# Patient Record
Sex: Female | Born: 2000 | Race: Black or African American | Hispanic: No | Smoking: Never smoker
Health system: Southern US, Community
[De-identification: ages and names within clinical notes are randomized; demographics above are authoritative.]

## PROBLEM LIST (undated history)

## (undated) DIAGNOSIS — B999 Unspecified infectious disease: Secondary | ICD-10-CM

## (undated) DIAGNOSIS — A749 Chlamydial infection, unspecified: Secondary | ICD-10-CM

## (undated) DIAGNOSIS — D649 Anemia, unspecified: Secondary | ICD-10-CM

## (undated) DIAGNOSIS — Z789 Other specified health status: Secondary | ICD-10-CM

## (undated) HISTORY — PX: NO PAST SURGERIES: SHX2092

## (undated) HISTORY — DX: Other specified health status: Z78.9

---
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
## 2020-01-30 ENCOUNTER — Ambulatory Visit: Payer: Medicaid Other | Admitting: Certified Nurse Midwife

## 2020-10-11 NOTE — L&D Delivery Note (Signed)
OB/GYN Faculty Practice Delivery Note  Crystal Roach is a 20 y.o. G2P1001 s/p SVD at [redacted]w[redacted]d. She was admitted for SOL.   ROM: 1h 65m with clear fluid GBS Status: negative Maximum Maternal Temperature: 98.9  Delivery Date/Time: 1121 Delivery: Called to room and patient was complete and pushing. Head delivered ROA. No nuchal cord present. Shoulder and body delivered in usual fashion. Infant with spontaneous cry, placed on mother's abdomen, dried and stimulated. Cord clamped x 2 after 1-minute delay, and cut by FOB under my direct supervision. Cord blood drawn. Placenta delivered spontaneously with gentle cord traction. Fundus firm with massage and Pitocin. Labia, perineum, vagina, and cervix were inspected, with a hemostatic first degree laceration. Laceration was repaired with 3-0 vicryl.   Placenta: 3VC, membranes intact >placenta to L&D Complications: none Lacerations: 1st degree EBL: 50 Analgesia: epidural  Infant: baby girl   APGARs 8,9   Crystal Cruz, MD PGY-2 Family Medicine Resident

## 2021-01-14 IMAGING — US OBSTETRIC <14 WK US AND TRANSVAGINAL OB US
1 series · 15 of 28 positions shown · non-contrast
Comparison: None.

CLINICAL DATA: Pregnant patient with vaginal bleeding.

EXAM:
OBSTETRIC <14 WK US AND TRANSVAGINAL OB US
TECHNIQUE: Both transabdominal and transvaginal ultrasound examinations were
performed for complete evaluation of the gestation as well as the
maternal uterus, adnexal regions, and pelvic cul-de-sac.
Transvaginal technique was performed to assess early pregnancy.

[Series 1: obstetric <14 wk us and transvaginal ob us · 65 acquisitions, 15 frames shown]
[im 1/65]
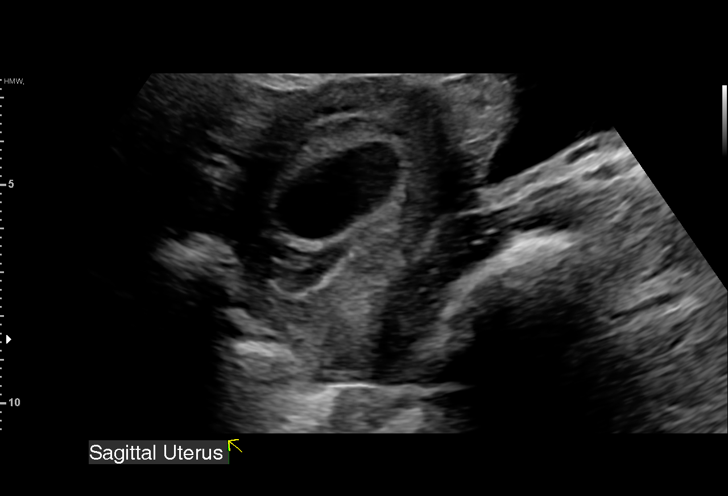
[im 5/65]
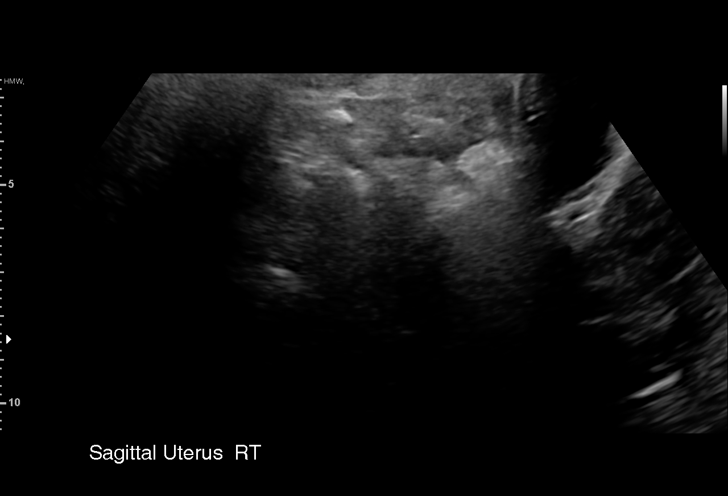
[im 10/65]
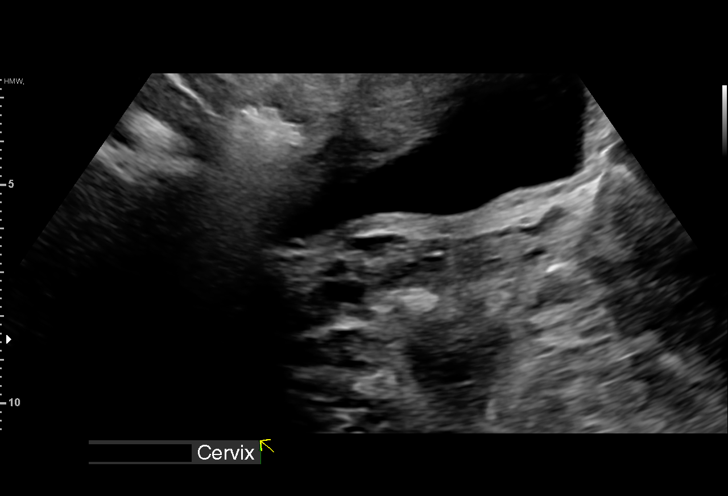
[im 15/65]
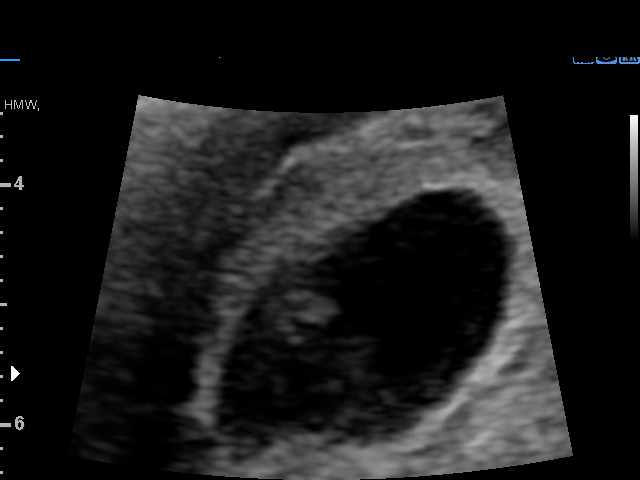
[im 19/65]
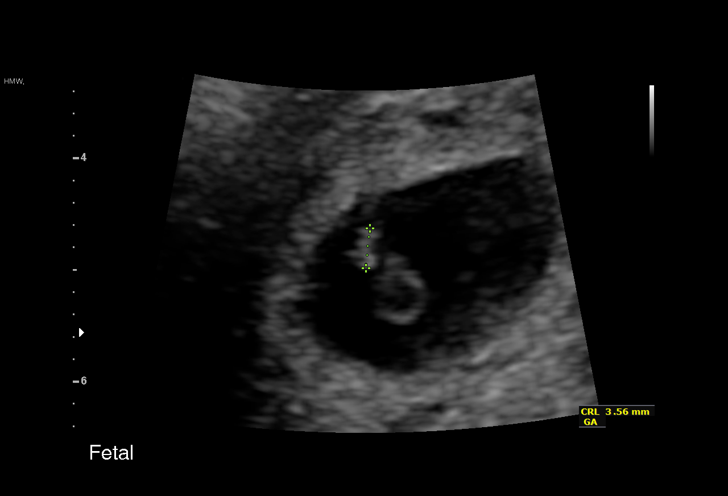
[im 24/65]
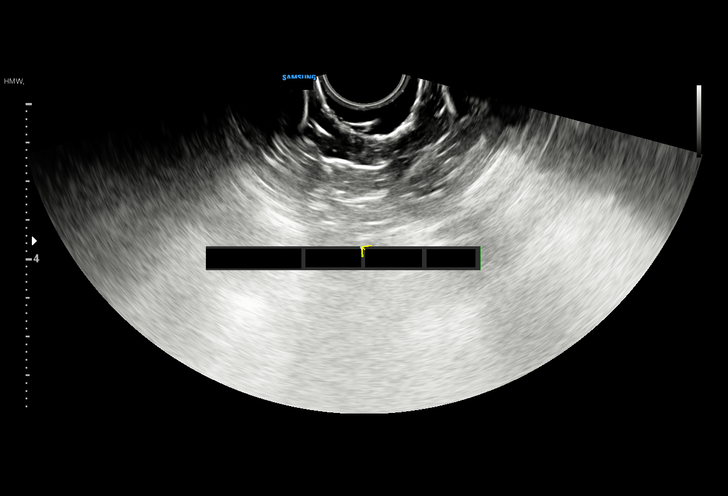
[im 29/65]
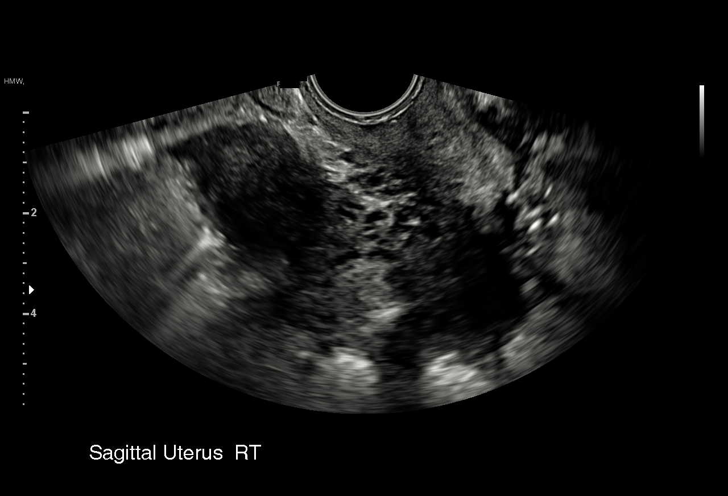
[im 34/65]
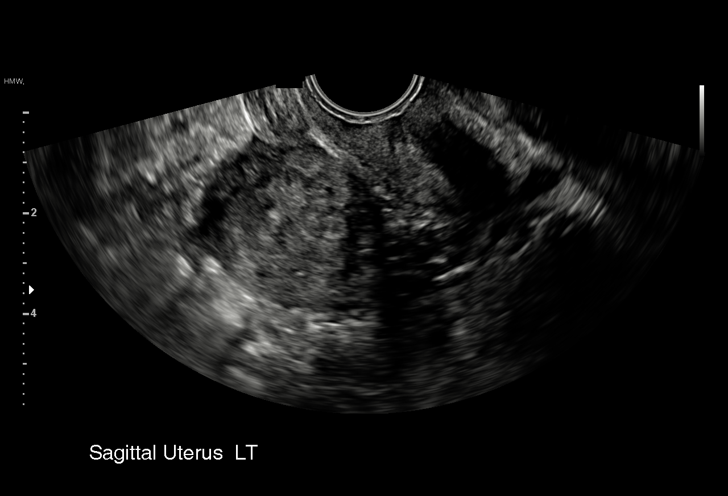
[im 36/65]
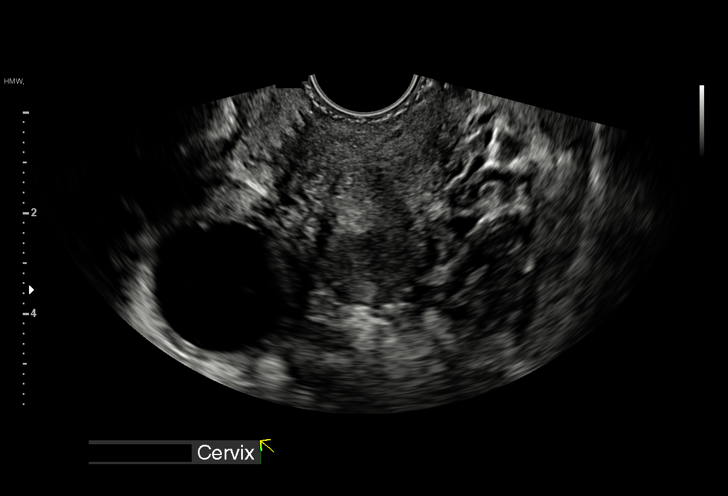
[im 41/65]
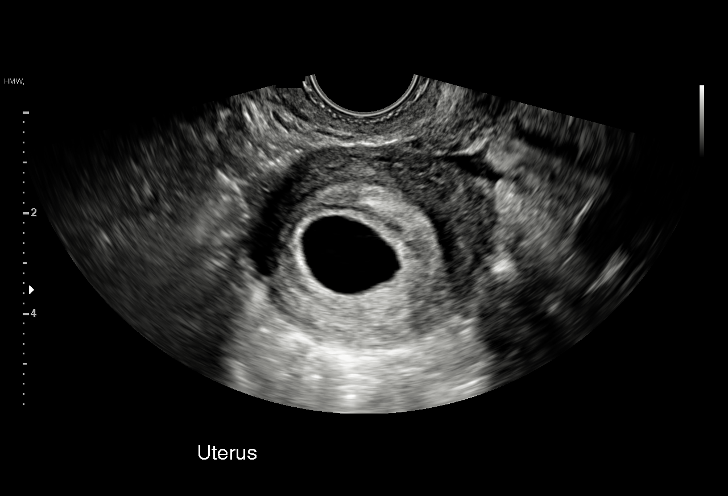
[im 46/65]
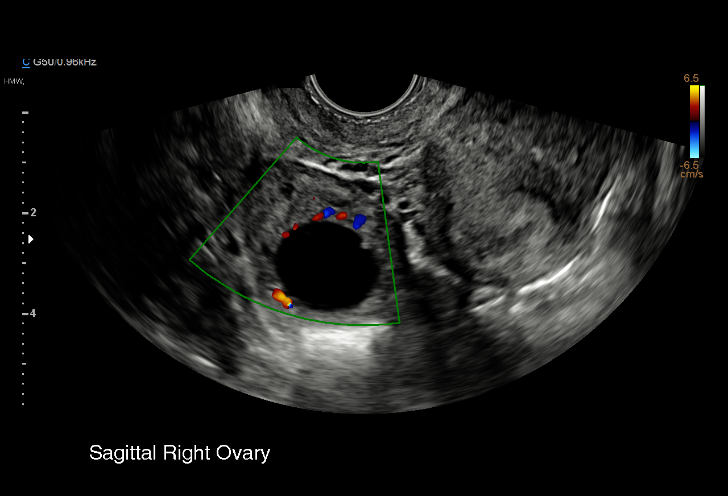
[im 50/65]
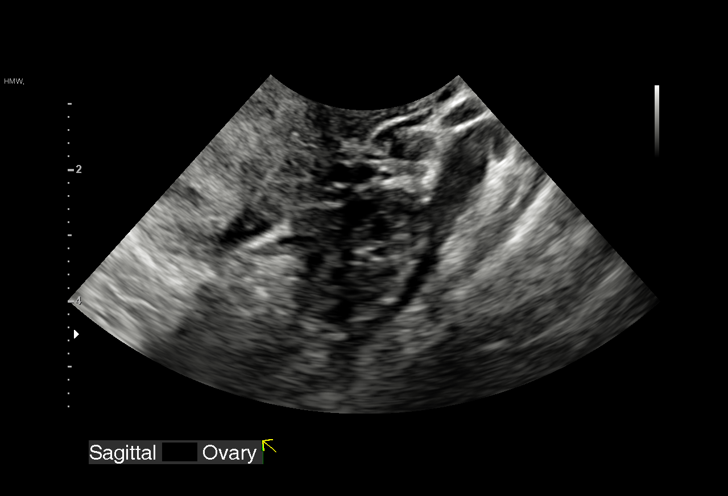
[im 55/65]
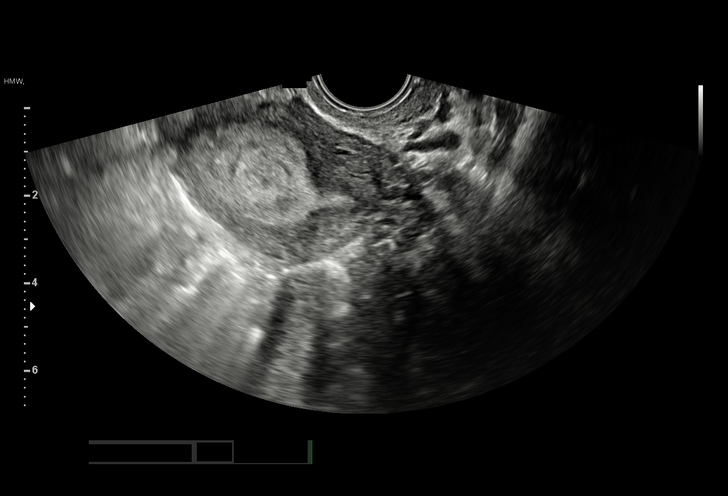
[im 60/65]
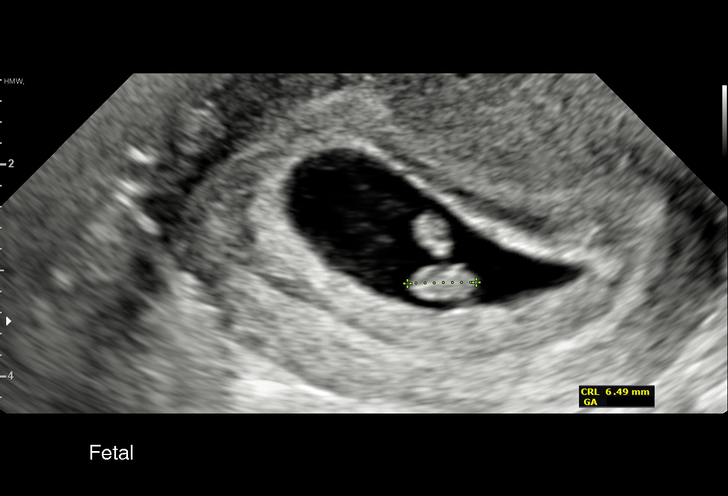
[im 65/65]
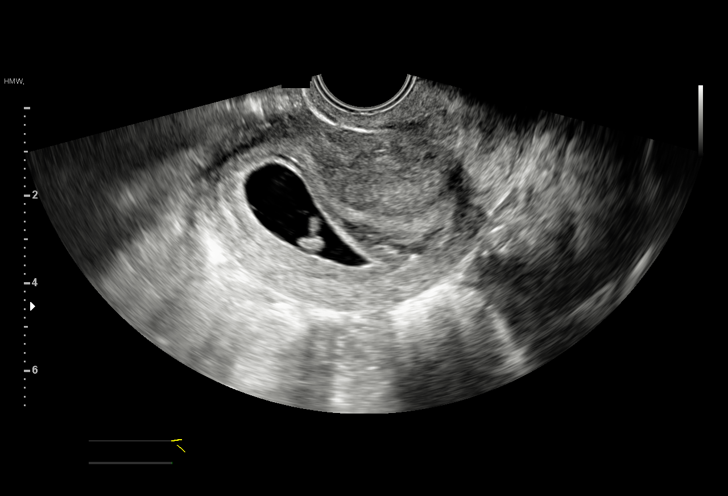

[15 of 28 positions shown; findings below may reference images not displayed]

FINDINGS: Intrauterine gestational sac: Single

Yolk sac:  Visualized.

Embryo:  Visualized.

Cardiac Activity: Visualized.

Heart Rate: 123 bpm

CRL:  6.3 mm   6 w   3 d                  US EDC: 12/17/2019

Subchorionic hemorrhage:  Small

Maternal uterus/adnexae: Normal right and left ovaries. Right
ovarian corpus luteum. No free fluid in the pelvis.
IMPRESSION: Single live intrauterine gestation.  Small subchorionic hemorrhage.

## 2021-01-21 ENCOUNTER — Encounter (HOSPITAL_COMMUNITY): Payer: Self-pay

## 2021-01-21 ENCOUNTER — Ambulatory Visit (HOSPITAL_COMMUNITY)
Admission: EM | Admit: 2021-01-21 | Discharge: 2021-01-21 | Disposition: A | Discharge status: Home / Self Care | Payer: Medicaid Other | Attending: Internal Medicine | Admitting: Internal Medicine

## 2021-01-21 ENCOUNTER — Other Ambulatory Visit: Payer: Self-pay

## 2021-01-21 DIAGNOSIS — Z3201 Encounter for pregnancy test, result positive: Secondary | ICD-10-CM

## 2021-01-21 LAB — POC URINE PREG, ED: Preg Test, Ur: POSITIVE — AB

## 2021-01-21 NOTE — ED Provider Notes (Signed)
[place]    CSN: [id number] Arrival date & time: 01/21/21  1007      History   Chief Complaint Chief Complaint  Patient presents with  . Possible Pregnancy    HPI Crystal Roach is a 20 y.o. female.   Patient presenting today requesting a pregnancy confirmation.  She states that her last menstrual period was 12/10/2020.  She took some home pregnancy test yesterday which were positive.  She denies cramping, vaginal bleeding, vaginal discharge, abdominal pain, nausea, vomiting.  Delivered her first child March 7209 without complications.  Has an OB/GYN that she will schedule with for her initial prenatal visit.     Past Medical History:  Diagnosis Date  . Chlamydia   . Infection    UTI  . Medical history non-contributory     Patient Active Problem List   Diagnosis Date Noted  . Indication for care or intervention in labor or delivery 12/16/2019  . Candida vaginitis 11/23/2019  . Gastroesophageal reflux disease without esophagitis 08/01/2019  . Tail bone pain 08/01/2019  . Supervision of normal first pregnancy, antepartum 04/30/2019    Past Surgical History:  Procedure Laterality Date  . NO PAST SURGERIES      OB History    Gravida  2   Para  1   Term  1   Preterm      AB      Living  1     SAB      IAB      Ectopic      Multiple  0   Live Births  1            Home Medications    Prior to Admission medications   Medication Sig Start Date End Date Taking? Authorizing Provider  acetaminophen (TYLENOL) 325 MG tablet Take 2 tablets (650 mg total) by mouth every 4 (four) hours as needed (for pain scale < 4). 12/19/19   Clarnce Flock, MD  Blood Pressure Monitoring KIT Automatic blood pressure cuff with regular sized cuff. Blood pressure to be monitored regularly at home. ICD-10 code: O79.90. 08/01/19   Donnamae Jude, MD  calcium carbonate (TUMS - DOSED IN MG ELEMENTAL CALCIUM) 500 MG chewable tablet Chew 1 tablet by mouth  daily as needed for indigestion or heartburn.    [provider]  ibuprofen (ADVIL) 600 MG tablet Take 1 tablet (600 mg total) by mouth every 6 (six) hours. 12/19/19   Clarnce Flock, MD  Misc. Devices (GOJJI WEIGHT SCALE) MISC 1 Device by Does not apply route daily as needed. To weight self daily as needed at home. ICD-10 code: O09.90 10/25/19   Donnamae Jude, MD  polyethylene glycol powder (GLYCOLAX/MIRALAX) 17 GM/SCOOP powder Take 17 g by mouth daily as needed (constipation). 12/19/19   Clarnce Flock, MD  Prenatal Vit-Fe Fumarate-FA (PREPLUS) 27-1 MG TABS Take 1 tablet by mouth daily. 04/26/19   Darlina Rumpf, CNM  SPRINTEC 28 0.25-35 MG-MCG tablet Take 1 tablet by mouth daily. Not taking in one month 05/15/18 04/12/19  [provider]    Family History Family History  Problem Relation Age of Onset  . Seizures Father     Social History Social History   Tobacco Use  . Smoking status: Never Smoker  . Smokeless tobacco: Never Used  Vaping Use  . Vaping Use: Never used  Substance Use Topics  . Alcohol use: Never  . Drug use: Yes    Types:  Marijuana    Comment: last was 3wk ago when found preg     Allergies   Patient has no known allergies.   Review of Systems Review of Systems Per HPI  Physical Exam Triage Vital Signs ED Triage Vitals  Enc Vitals Group     BP 01/21/21 1029 123/71     Pulse Rate 01/21/21 1029 75     Resp 01/21/21 1029 17     Temp 01/21/21 1029 98.6 F (37 C)     Temp src --      SpO2 01/21/21 1029 100 %     Weight --      Height --      Head Circumference --      Peak Flow --      Pain Score 01/21/21 1026 0     Pain Loc --      Pain Edu? --      Excl. in [place]? --    No data found.  Updated Vital Signs BP 123/71   Pulse 75   Temp 98.6 F (37 C)   Resp 17   LMP 12/10/2020   SpO2 100%   Breastfeeding Yes   Visual Acuity Right Eye Distance:   Left Eye Distance:   Bilateral Distance:    Right Eye Near:    Left Eye Near:    Bilateral Near:     Physical Exam Vitals and nursing note reviewed.  Constitutional:      Appearance: Normal appearance. She is not ill-appearing.  HENT:     Head: Atraumatic.     Mouth/Throat:     Mouth: Mucous membranes are moist.     Pharynx: Oropharynx is clear. No posterior oropharyngeal erythema.  Eyes:     Extraocular Movements: Extraocular movements intact.     Conjunctiva/sclera: Conjunctivae normal.  Cardiovascular:     Rate and Rhythm: Normal rate and regular rhythm.     Heart sounds: Normal heart sounds.  Pulmonary:     Effort: Pulmonary effort is normal.     Breath sounds: Normal breath sounds.  Abdominal:     General: Bowel sounds are normal. There is no distension.     Palpations: Abdomen is soft.     Tenderness: There is no abdominal tenderness. There is no right CVA tenderness, left CVA tenderness or guarding.  Musculoskeletal:        General: Normal range of motion.     Cervical back: Normal range of motion and neck supple.  Skin:    General: Skin is warm and dry.  Neurological:     Mental Status: She is alert and oriented to person, place, and time.  Psychiatric:        Mood and Affect: Mood normal.        Thought Content: Thought content normal.        Judgment: Judgment normal.    UC Treatments / Results  Labs (all labs ordered are listed, but only abnormal results are displayed) Labs Reviewed  POC URINE PREG, ED - Abnormal; Notable for the following components:      Result Value   Preg Test, Ur POSITIVE (*)    All other components within normal limits    EKG   Radiology No results found.  Procedures Procedures (including critical care time)  Medications Ordered in UC Medications - No data to display  Initial Impression / Assessment and Plan / UC Course  I have reviewed the triage vital signs and the nursing notes.  Pertinent  labs & imaging results that were available during my care of the patient were reviewed by  me and considered in my medical decision making (see chart for details).     Pregnancy test today positive, exam and vitals benign and reassuring.  Discussed prenatal vitamins, lifestyle modifications, initial OB visit tween 8 to [redacted] weeks gestation.  Final Clinical Impressions(s) / UC Diagnoses   Final diagnoses:  Positive pregnancy test   Discharge Instructions   None    ED Prescriptions    None     PDMP not reviewed this encounter.   Volney American, [place] 01/21/21 1112

## 2021-01-21 NOTE — ED Triage Notes (Signed)
Pt in requesting pregnancy testing. States she took 3 at home test with positive results but needs proof for her job

## 2021-02-24 ENCOUNTER — Ambulatory Visit (INDEPENDENT_AMBULATORY_CARE_PROVIDER_SITE_OTHER): Payer: Medicaid Other | Admitting: *Deleted

## 2021-02-24 ENCOUNTER — Other Ambulatory Visit: Payer: Self-pay

## 2021-02-24 ENCOUNTER — Other Ambulatory Visit (HOSPITAL_COMMUNITY)
Admission: RE | Admit: 2021-02-24 | Discharge: 2021-02-24 | Disposition: A | Discharge status: Home / Self Care | Payer: Medicaid Other | Source: Ambulatory Visit | Attending: Obstetrics and Gynecology | Admitting: Obstetrics and Gynecology

## 2021-02-24 VITALS — BP 97/63 | HR 80 | Temp 97.7°F | Wt 115.6 lb

## 2021-02-24 DIAGNOSIS — O26899 Other specified pregnancy related conditions, unspecified trimester: Secondary | ICD-10-CM | POA: Diagnosis present

## 2021-02-24 DIAGNOSIS — Z3480 Encounter for supervision of other normal pregnancy, unspecified trimester: Secondary | ICD-10-CM | POA: Diagnosis present

## 2021-02-24 DIAGNOSIS — Z3A10 10 weeks gestation of pregnancy: Secondary | ICD-10-CM

## 2021-02-24 DIAGNOSIS — N898 Other specified noninflammatory disorders of vagina: Secondary | ICD-10-CM | POA: Diagnosis present

## 2021-02-24 LAB — OB RESULTS CONSOLE GC/CHLAMYDIA: Gonorrhea: NEGATIVE

## 2021-02-24 MED ORDER — VITAFOL GUMMIES 3.33-0.333-34.8 MG PO CHEW: 3.0000 | CHEWABLE_TABLET | Freq: Every day | ORAL | 12 refills | Status: DC

## 2021-02-24 NOTE — Progress Notes (Signed)
   Location: [facility] Patient: clinic Provider: clinic  PRENATAL INTAKE SUMMARY  Ms. Delahoz presents today New OB Nurse Interview.  OB History    Gravida  2   Para  1   Term  1   Preterm      AB      Living  1     SAB      IAB      Ectopic      Multiple  0   Live Births  1          I have reviewed the patient's medical, obstetrical, social, and family histories, medications, and available lab results.  SUBJECTIVE She has complaints of egg white vaginal discharge.  OBJECTIVE Initial Nurse interview for history/labs (New OB)  EDD: 09/16/21 by LMP GA: [redacted]w[redacted]d G2P1001 FHT: 171  GENERAL APPEARANCE: alert, well appearing, in no apparent distress, oriented to person, place and time   ASSESSMENT Normal pregnancy  PLAN Prenatal care:  [facility] Pnl/HIV/Hep C OB Urine Culture GC/CT/BV/yeast HgbEval/SMA/CF ([facility]) Panorama A1C Rx for prenatal gummies sent to pharmacy Patient has BP monitor and weight scale at home Re-enroll for Babyscripts  Follow Up Instructions:   I discussed the assessment and treatment plan with the patient. The patient was provided an opportunity to ask questions and all were answered. The patient agreed with the plan and demonstrated an understanding of the instructions.   The patient was advised to call back or seek an in-person evaluation if the symptoms worsen or if the condition fails to improve as anticipated.  I provided 35 minutes of  face-to-face time during this encounter.  Clovis Pu, RN

## 2021-02-24 NOTE — Patient Instructions (Addendum)
Genetic Screening Results Information: You are having genetic testing called [facility] today.  It will take approximately 2 weeks before the results are available.  To get your results, you need Internet access to a web browser to search [facility]/MyChart (the direct app on your phone will not give you these results).  Then select Lab Scanned and click on the blue hyper link that says View Image to see your [facility] results.  You can also use the directions on the purple card given to look up your results directly on the [facility] website.   [email or web address]">  First Trimester of Pregnancy  The first trimester of pregnancy starts on the first day of your last menstrual period until the end of week 12. This is also called months 1 through 3 of pregnancy. Body changes during your first trimester Your body goes through many changes during pregnancy. The changes usually return to normal after your baby is born. Physical changes  You may gain or lose weight.  Your breasts may grow larger and hurt. The area around your nipples may get darker.  Dark spots or blotches may develop on your face.  You may have changes in your hair. Health changes  You may feel like you might vomit (nauseous), and you may vomit.  You may have heartburn.  You may have headaches.  You may have trouble pooping (constipation).  Your gums may bleed. Other changes  You may get tired easily.  You may pee (urinate) more often.  Your menstrual periods will stop.  You may not feel hungry.  You may want to eat certain kinds of food.  You may have changes in your emotions from day to day.  You may have more dreams. Follow these instructions at home: Medicines  Take over-the-counter and prescription medicines only as told by your doctor. Some medicines are not safe during pregnancy.  Take a prenatal vitamin that contains at least 600 micrograms (mcg) of folic acid. Eating and  drinking  Eat healthy meals that include: ? Fresh fruits and vegetables. ? Whole grains. ? Good sources of protein, such as meat, eggs, or tofu. ? Low-fat dairy products.  Avoid raw meat and unpasteurized juice, milk, and cheese.  If you feel like you may vomit, or you vomit: ? Eat 4 or 5 small meals a day instead of 3 large meals. ? Try eating a few soda crackers. ? Drink liquids between meals instead of during meals.  You may need to take these actions to prevent or treat trouble pooping: ? Drink enough fluids to keep your pee (urine) pale yellow. ? Eat foods that are high in fiber. These include beans, whole grains, and fresh fruits and vegetables. ? Limit foods that are high in fat and sugar. These include fried or sweet foods. Activity  Exercise only as told by your doctor. Most people can do their usual exercise routine during pregnancy.  Stop exercising if you have cramps or pain in your lower belly (abdomen) or low back.  Do not exercise if it is too hot or too humid, or if you are in a place of great height (high altitude).  Avoid heavy lifting.  If you choose to, you may have sex unless your doctor tells you not to. Relieving pain and discomfort  Wear a good support bra if your breasts are sore.  Rest with your legs raised (elevated) if you have leg cramps or low back pain.  If you have bulging veins (varicose veins) in your  legs: ? Wear support hose as told by your doctor. ? Raise your feet for 15 minutes, 3-4 times a day. ? Limit salt in your food. Safety  Wear your seat belt at all times when you are in a car.  Talk with your doctor if someone is hurting you or yelling at you.  Talk with your doctor if you are feeling sad or have thoughts of hurting yourself. Lifestyle  Do not use hot tubs, steam rooms, or saunas.  Do not douche. Do not use tampons or scented sanitary pads.  Do not use herbal medicines, illegal drugs, or medicines that are not  approved by your doctor. Do not drink alcohol.  Do not smoke or use any products that contain nicotine or tobacco. If you need help quitting, ask your doctor.  Avoid cat litter boxes and soil that is used by cats. These carry germs that can cause harm to the baby and can cause a loss of your baby by miscarriage or stillbirth. General instructions  Keep all follow-up visits. This is important.  Ask for help if you need counseling or if you need help with nutrition. Your doctor can give you advice or tell you where to go for help.  Visit your dentist. At home, brush your teeth with a soft toothbrush. Floss gently.  Write down your questions. Take them to your prenatal visits. Where to find more information  American Pregnancy Association: americanpregnancy.org  [employer] of [employer]stetricians [employer]necologists: www.acog.org  Office on Women's Health: KeywordPortfolios.com.br Contact a doctor if:  You are dizzy.  You have a fever.  You have mild cramps or pressure in your lower belly.  You have a nagging pain in your belly area.  You continue to feel like you may vomit, you vomit, or you have watery poop (diarrhea) for 24 hours or longer.  You have a bad-smelling fluid coming from your vagina.  You have pain when you pee.  You are exposed to a disease that spreads from person to person, such as chickenpox, measles, Zika virus, HIV, or hepatitis. Get help right away if:  You have spotting or bleeding from your vagina.  You have very bad belly cramping or pain.  You have shortness of breath or chest pain.  You have any kind of injury, such as from a fall or a car crash.  You have new or increased pain, swelling, or redness in an arm or leg. Summary  The first trimester of pregnancy starts on the first day of your last menstrual period until the end of week 12 (months 1 through 3).  Eat 4 or 5 small meals a day instead of 3 large meals.  Do not smoke or use any  products that contain nicotine or tobacco. If you need help quitting, ask your doctor.  Keep all follow-up visits. This information is not intended to replace advice given to you by your health care provider. Make sure you discuss any questions you have with your health care provider. Document Revised: 03/05/2020 Document Reviewed: 01/10/2020 Elsevier Patient Education  2021 [place].  Warning Signs During Pregnancy During pregnancy, your body goes through many changes. Some changes may be uncomfortable, but most do not represent a serious problem. However, it is important to learn when certain signs and symptoms may indicate a problem. Talk with your health care provider about your current health and any medical conditions you have. Make sure you know the symptoms to watch for and report. How does this  affect me? Warning signs during pregnancy Let your health care provider know if you have any of the following warning signs:  Dizziness or feeling faint.  Nausea, vomiting, or diarrhea that lasts 24 hours or longer.  Spotting or bleeding from your vagina.  Abdominal cramping or pain in your pelvis or lower back.  Shortness of breath, difficulty breathing, or chest pain.  New or increased pain, swelling, or redness in an arm or leg.  Your baby is moving less than usual or is not moving. You should also watch for signs of a serious medical condition called preeclampsia. This may include:  A severe, throbbing headache that does not go away.  Vision changes, such as blurred or double vision, light sensitivity, or seeing spots in front of your eyes.  Sudden or extreme swelling of your face, hands, legs, or feet. Pregnancy causes changes that may make it more likely for you to get an infection. Let your health care provider know if you have signs of infection, such as:  A fever.  A bad-smelling vaginal discharge.  Pain or burning when you urinate. How does this affect my  baby? Throughout your pregnancy, always report any of the warning signs of a problem to your health care provider. This can help prevent complications that may affect your baby, including:  Increased risk for premature birth.  Infection that may be transmitted to your baby.  Increased risk for stillbirth. Follow these instructions at home:  Take over-the-counter and prescription medicines only as told by your health care provider.  Keep all follow-up visits. This is important.   Where to find more information  Office on Women's Health: CelebrityForeclosures.cz  [employer] of Obstetricians and Gynecologists: EmploymentAssurance.cz Contact a health care provider if:  You have any warning signs of problems during your pregnancy.  Any of the following apply to you during your pregnancy: ? You have strong emotions, such as sadness or anxiety, that interfere with work or personal relationships. ? You feel unsafe in your home. ? You are using tobacco products, alcohol, or drugs, and you need help to stop. Get help right away if:  You have signs or symptoms of labor before 37 weeks of pregnancy. These include: ? Contractions that are 5 minutes or less apart, or that increase in frequency, intensity, or length. ? Sudden, sharp abdominal pain or low back pain. ? Uncontrolled gush or trickle of fluid from your vagina. Summary  Always report any warning signs to your health care provider to prevent complications that may affect both you and your baby.  Talk with your health care provider about your current health and any medical conditions you have. Make sure you know the symptoms to watch for and report.  Keep all follow-up visits. This is important. This information is not intended to replace advice given to you by your health care provider. Make sure you discuss any questions you have with your health care provider. Document Revised: 03/05/2020 Document Reviewed:  02/01/2020 Elsevier Patient Education  2021 [employer].

## 2021-02-25 LAB — CERVICOVAGINAL ANCILLARY ONLY
Bacterial Vaginitis (gardnerella): NEGATIVE
Candida Glabrata: NEGATIVE
Candida Vaginitis: POSITIVE — AB
Chlamydia: NEGATIVE
Comment: NEGATIVE
Comment: NEGATIVE
Comment: NEGATIVE
Comment: NEGATIVE
Comment: NEGATIVE
Comment: NORMAL
Neisseria Gonorrhea: NEGATIVE
Trichomonas: NEGATIVE

## 2021-02-26 LAB — OBSTETRIC PANEL, INCLUDING HIV
Antibody Screen: NEGATIVE
Basophils Absolute: 0.1 10*3/uL (ref 0.0–0.2)
Basos: 1 %
EOS (ABSOLUTE): 0.2 10*3/uL (ref 0.0–0.4)
Eos: 2 %
HIV Screen 4th Generation wRfx: NONREACTIVE
Hematocrit: 36.3 % (ref 34.0–46.6)
Hemoglobin: 11.8 g/dL (ref 11.1–15.9)
Hepatitis B Surface Ag: NEGATIVE
Immature Grans (Abs): 0 10*3/uL (ref 0.0–0.1)
Immature Granulocytes: 0 %
Lymphocytes Absolute: 2.5 10*3/uL (ref 0.7–3.1)
Lymphs: 25 %
MCH: 26.8 pg (ref 26.6–33.0)
MCHC: 32.5 g/dL (ref 31.5–35.7)
MCV: 82 fL (ref 79–97)
Monocytes Absolute: 0.6 10*3/uL (ref 0.1–0.9)
Monocytes: 6 %
Neutrophils Absolute: 6.7 10*3/uL (ref 1.4–7.0)
Neutrophils: 66 %
Platelets: 282 10*3/uL (ref 150–450)
RBC: 4.41 x10E6/uL (ref 3.77–5.28)
RDW: 13.5 % (ref 11.7–15.4)
RPR Ser Ql: NONREACTIVE
Rh Factor: POSITIVE
Rubella Antibodies, IGG: 4.22 index (ref >0.99)
WBC: 10.1 10*3/uL (ref 3.4–10.8)

## 2021-02-26 LAB — HEMOGLOBIN A1C
Est. average glucose Bld gHb Est-mCnc: 105 mg/dL
Hgb A1c MFr Bld: 5.3 % (ref 4.8–5.6)

## 2021-02-26 LAB — HEPATITIS C ANTIBODY: Hep C Virus Ab: <0.1 s/co ratio (ref 0.0–0.9)

## 2021-02-27 ENCOUNTER — Other Ambulatory Visit: Payer: Self-pay

## 2021-02-27 DIAGNOSIS — B373 Candidiasis of vulva and vagina: Secondary | ICD-10-CM

## 2021-02-27 DIAGNOSIS — B3731 Acute candidiasis of vulva and vagina: Secondary | ICD-10-CM

## 2021-02-27 LAB — CULTURE, OB URINE

## 2021-02-27 LAB — URINE CULTURE, OB REFLEX

## 2021-02-27 MED ORDER — TERCONAZOLE 0.4 % VA CREA: 1.0000 | TOPICAL_CREAM | Freq: Every day | VAGINAL | 0 refills | Status: DC

## 2021-03-11 NOTE — Progress Notes (Deleted)
***-RISK PREGNANCY OFFICE VISIT  Patient name: Crystal Roach MRN [id number]  Date of birth: 24-Apr-2001 Chief Complaint:   No chief complaint on file.  Subjective:   Crystal Roach is a 20 y.o. G68P1001 female at [redacted]w[redacted]d with an Estimated Date of Delivery: 09/16/21 being seen today for ongoing management of a ***-risk pregnancy aeb has Gastroesophageal reflux disease without esophagitis; Tail bone pain; Candida vaginitis; and Supervision of other normal pregnancy, antepartum on their problem list.  Patient presents today with {sx:14538}.  Patient endorses fetal movement. Patient denies abdominal cramping or contractions.  Patient {Actions; denies-reports:120008} vaginal concerns including abnormal discharge, leaking of fluid, and bleeding.   .  .   .  Reviewed past medical,surgical, social, obstetrical and family history as well as problem list, medications and allergies.  Objective  There were no vitals filed for this visit.There is no height or weight on file to calculate BMI.  Total Weight Gain:-4 lb 6.4 oz (-1.996 kg)         Physical Examination:   General appearance: Well appearing, and in no distress  Mental status: Alert, oriented to person, place, and time  Skin: Warm & dry  Cardiovascular: Normal heart rate noted  Respiratory: Normal respiratory effort, no distress  Abdomen: Soft, gravid, nontender, ***GA with    Pelvic: {Blank single:19197::"Cervical exam performed","Cervical exam deferred"}           Extremities:    Fetal Status:        No results found for this or any previous visit (from the past 24 hour(s)).  Assessment & Plan:  ***-risk pregnancy of a 20 y.o., G2P1001 at [redacted]w[redacted]d with an Estimated Date of Delivery: 09/16/21   There are no diagnoses linked to this encounter.     Meds: No orders of the defined types were placed in this encounter.  Labs/procedures today:  Lab Orders  No laboratory test(s) ordered today     Reviewed: {Blank  single:19197::"Term","Preterm"} labor symptoms and general obstetric precautions including but not limited to vaginal bleeding, contractions, leaking of fluid and fetal movement were reviewed in detail with the patient.  All questions were answered.  Follow-up: No follow-ups on file.  No orders of the defined types were placed in this encounter.  Cherre Robins MSN, CNM 03/11/2021     *** Indications for ASA therapy (per uptodate) One of the following: Previous pregnancy with preeclampsia, especially early onset and with an adverse outcome {yes/no:20286} Multifetal gestation {yes/no:20286} Chronic hypertension {yes/no:20286} Type 1 or 2 diabetes mellitus {yes/no:20286} Chronic kidney disease {yes/no:20286} Autoimmune disease (antiphospholipid syndrome, systemic lupus erythematosus) {yes/no:20286}  *** Two or more of the following: Nulliparity {yes/no:20286} Obesity (body mass index >30 kg/m2) {yes/no:20286} Family history of preeclampsia in mother or sister {yes/no:20286} Age ?35 years {yes/no:20286} Sociodemographic characteristics (African American race, low socioeconomic level) {yes/no:20286} Personal risk factors (eg, previous pregnancy with low birth weight or small for gestational age infant, previous adverse pregnancy outcome [eg, stillbirth], interval >10 years between pregnancies) {yes/no:20286}  *** Indications for early 1 hour GTT (per uptodate)  BMI >25 (>23 in Asian women) AND one of the following  Gestational diabetes mellitus in a previous pregnancy {yes/no:20286} Glycated hemoglobin ?5.7 percent (39 mmol/mol), impaired glucose tolerance, or impaired fasting glucose on previous testing {yes/no:20286} First-degree relative with diabetes {yes/no:20286} High-risk race/ethnicity (eg, African American, Latino, Native American, [place] American, [place] Islander) {yes/no:20286} History of cardiovascular disease {yes/no:20286} Hypertension or on therapy for hypertension  {yes/no:20286} High-density lipoprotein cholesterol level <35 mg/dL (1.21  mmol/L) and/or a triglyceride level >250 mg/dL (1.03 mmol/L) {XYO/FV:88677} Polycystic ovary syndrome {yes/no:20286} Physical inactivity {yes/no:20286} Other clinical condition associated with insulin resistance (eg, severe obesity, acanthosis nigricans) {yes/no:20286} Previous birth of an infant weighing ?4000 g {yes/no:20286} Previous stillbirth of unknown cause {yes/no:20286}

## 2021-04-27 IMAGING — US US MFM OB COMP +14 WKS
1 series · 13 of 28 positions shown · non-contrast
Comparison: none

[Series 1: us mfm ob comp +14 wks · 13 of 125 slices shown]
[im 5/125]
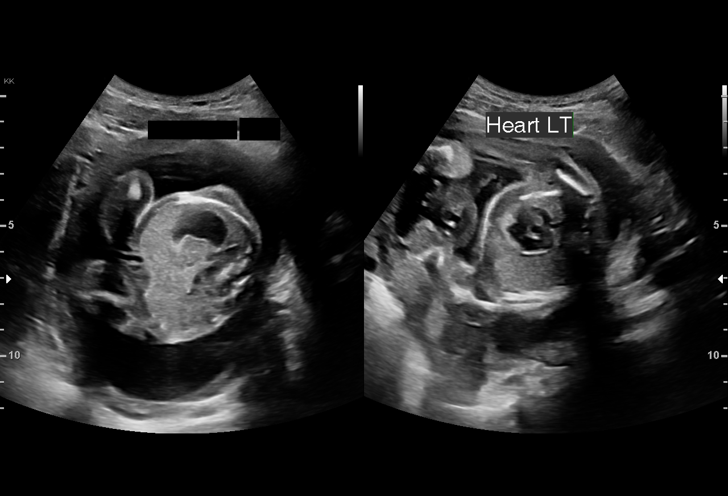
[im 14/125]
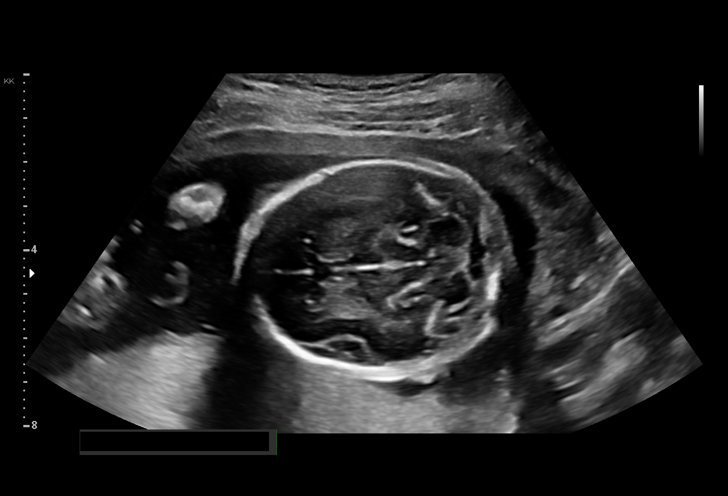
[im 23/125]
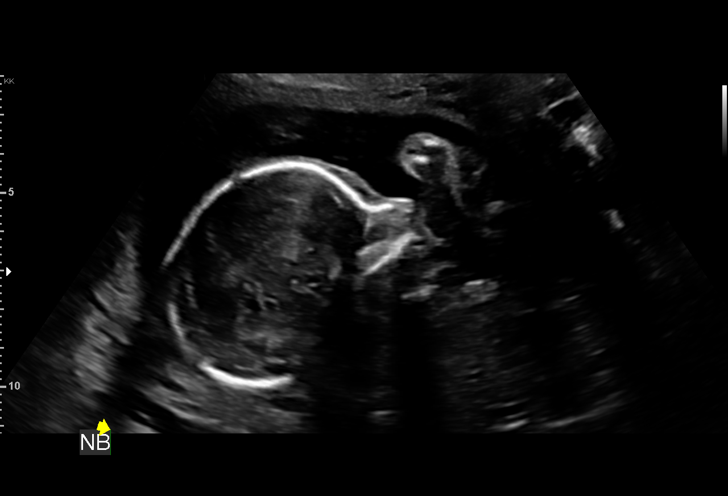
[im 33/125]
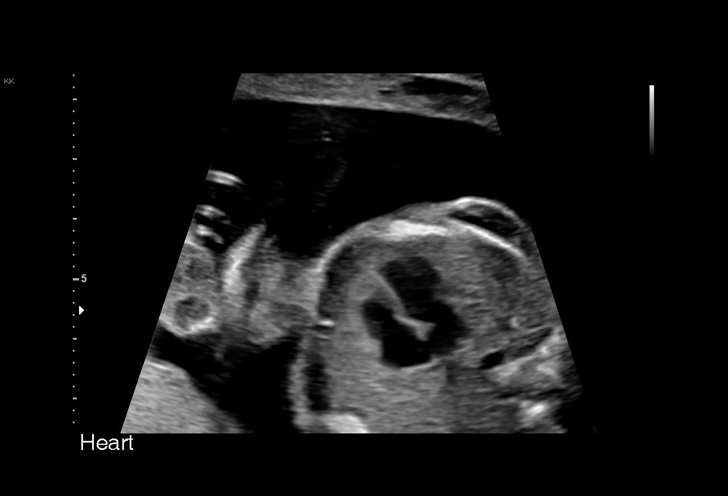
[im 42/125]
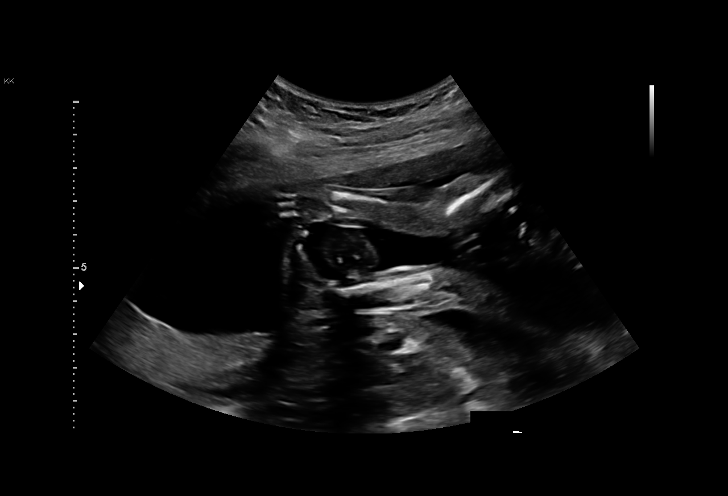
[im 51/125]
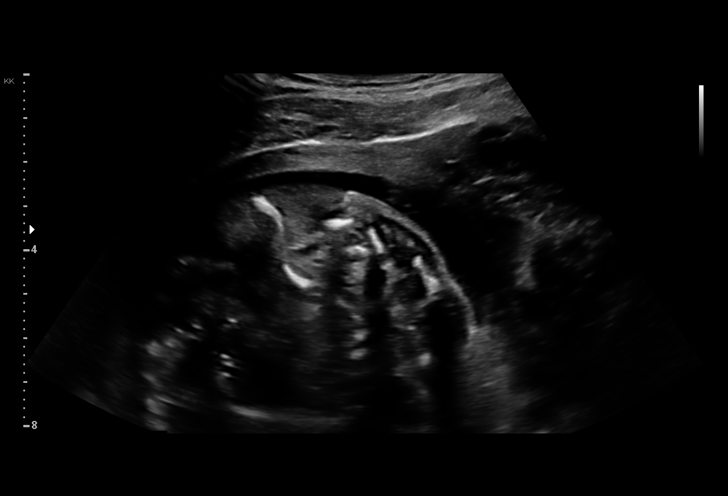
[im 65/125]
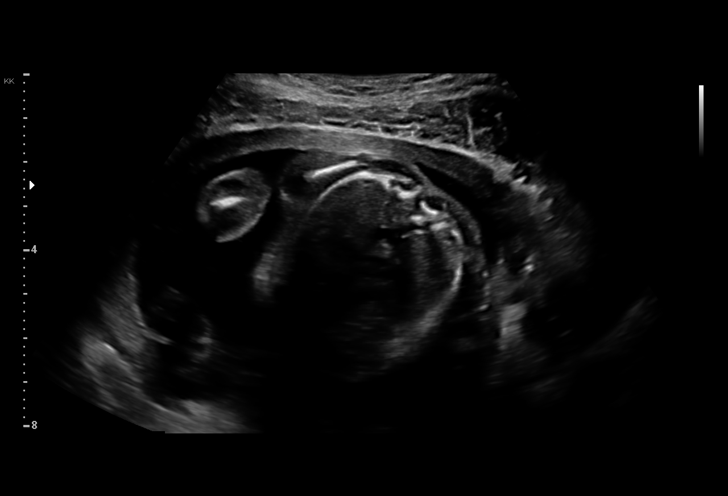
[im 74/125]
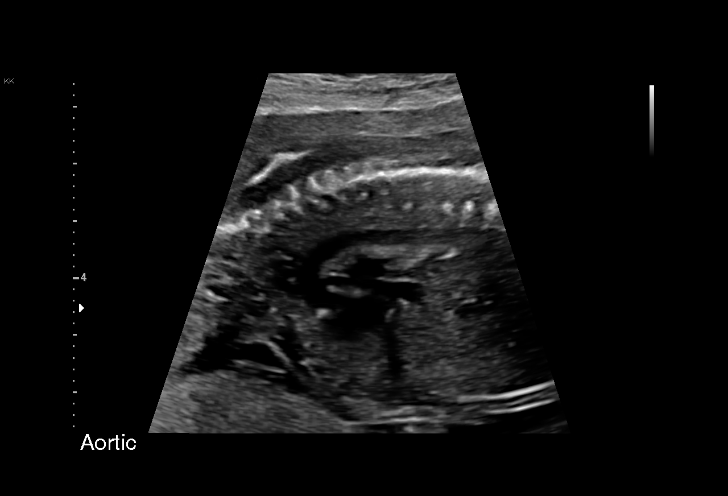
[im 83/125]
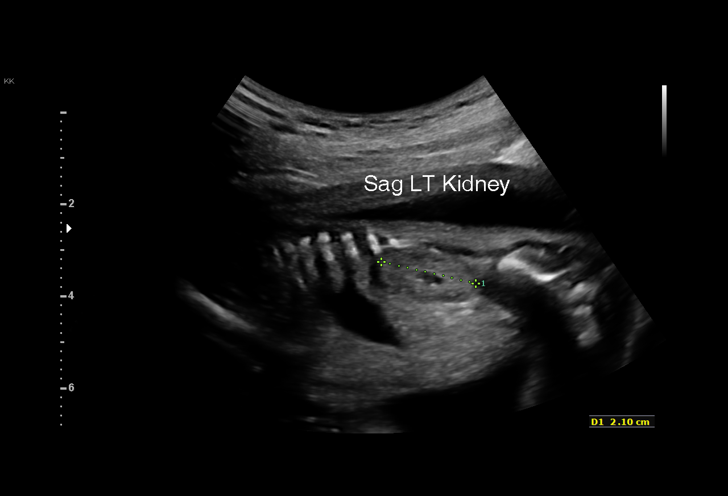
[im 92/125]
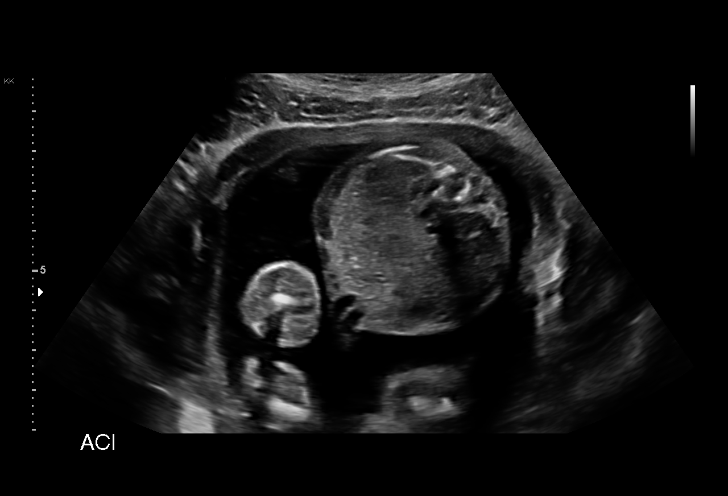
[im 102/125]
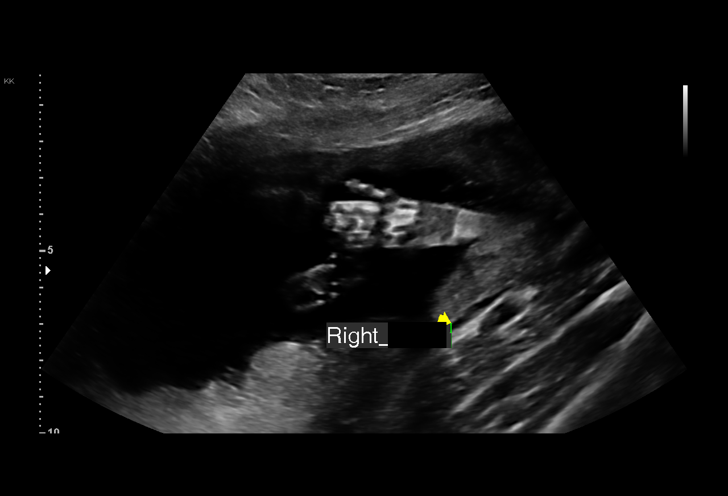
[im 111/125]
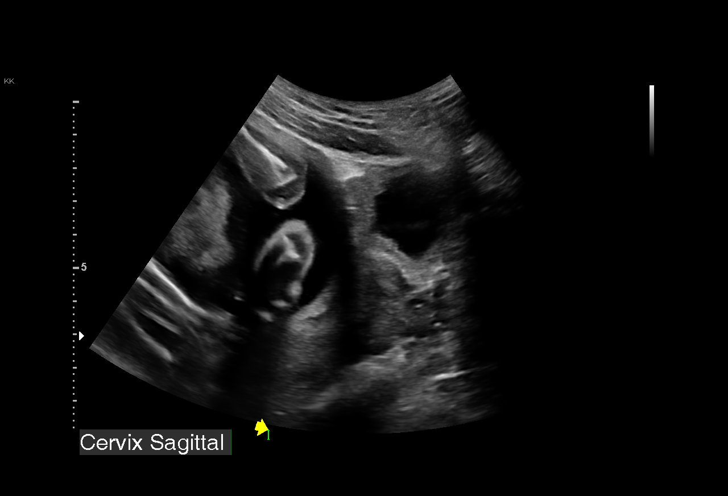
[im 120/125]
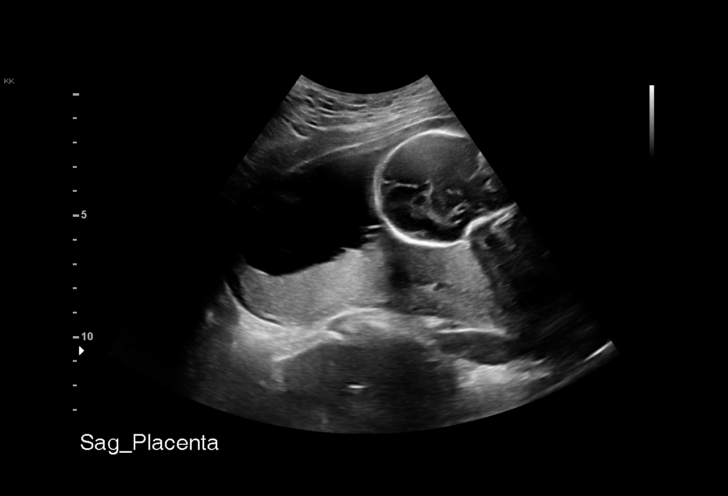

[13 of 28 positions shown; findings below may reference images not displayed]

ELI CNM

  1  US MFM OB COMP + 14 WK               76805.01     CHU SRIDHARAN
 ----------------------------------------------------------------------

 ----------------------------------------------------------------------
Indications

  Encounter for antenatal screening for
  malformations
  Genetic carrier (Silent Carrier Alpha-
  Thalassemia)
  Low Risk NIPS(Negative AFP)
  21 weeks gestation of pregnancy
 ----------------------------------------------------------------------
Fetal Evaluation

 Num Of Fetuses:         1
 Fetal Heart Rate(bpm):  151
 Cardiac Activity:       Observed
 Presentation:           Breech
 Placenta:               Posterior
 P. Cord Insertion:      Visualized

 Amniotic Fluid
 AFI FV:      Within normal limits

                             Largest Pocket(cm)

Biometry

 BPD:      48.2  mm     G. Age:  20w 4d         26  %    CI:        72.05   %    70 - 86
                                                         FL/HC:      19.9   %    15.9 -
 HC:      180.7  mm     G. Age:  20w 3d         15  %    HC/AC:      1.10        1.06 -
 AC:      163.7  mm     G. Age:  21w 3d         53  %    FL/BPD:     74.7   %
 FL:         36  mm     G. Age:  21w 3d         50  %    FL/AC:      22.0   %    20 - 24

 Est. FW:     413  gm    0 lb 15 oz      53  %
OB History
 Gravidity:    1
Gestational Age

 LMP:           25w 1d        Date:  02/12/19                 EDD:   11/19/19
 U/S Today:     21w 0d                                        EDD:   12/18/19
 Best:          21w 1d     Det. By:  Early Ultrasound         EDD:   12/17/19
                                     (04/26/19)
Anatomy

 Cranium:               Appears normal         LVOT:                   Appears normal
 Cavum:                 Appears normal         Aortic Arch:            Appears normal
 Ventricles:            Appears normal         Ductal Arch:            Appears normal
 Choroid Plexus:        Appears normal         Diaphragm:              Appears normal
 Cerebellum:            Appears normal         Stomach:                Appears normal, left
                                                                       sided
 Posterior Fossa:       Appears normal         Abdomen:                Appears normal
 Nuchal Fold:           Appears normal         Abdominal Wall:         Appears nml (cord
                                                                       insert, abd wall)
 Face:                  Appears normal         Cord Vessels:           Appears normal (3
                        (orbits and profile)                           vessel cord)
 Lips:                  Appears normal         Kidneys:                Appear normal
 Palate:                Not well visualized    Bladder:                Appears normal
 Thoracic:              Appears normal         Spine:                  Appears normal
 Heart:                 Not well visualized    Upper Extremities:      Appears normal
 RVOT:                  Appears normal         Lower Extremities:      Appears normal

 Other:  Female gender Heels visualized. Technically difficult due to fetal
         position.
Cervix Uterus Adnexa

 Cervix
 Length:            3.4  cm.
 Normal appearance by transabdominal scan.
Impression

 We performed fetal anatomy scan. No makers of
 aneuploidies or fetal structural defects are seen. Fetal
 biometry is consistent with her previously-established dates.
 Amniotic fluid is normal and good fetal activity is seen.
 Patient understands the limitations of ultrasound in detecting
 fetal anomalies.

 On cell-free fetal DNA screening, the risks of fetal
 aneuploidies are not increased. MSAFP screening showed
 low risk for open-neural tube defects.

 briefly discussed genetics and recommended partner
 screening. I encouraged her to meet with our genetic
 counselor. Patient informed she will discuss with her partner
 and decide.
Recommendations

 -An appointment was made for her to return in 4 weeks for
                 [place], Savio

## 2021-07-01 ENCOUNTER — Telehealth: Payer: Self-pay | Admitting: Licensed Clinical Social Worker

## 2021-07-01 ENCOUNTER — Encounter: Payer: Medicaid Other | Admitting: Certified Nurse Midwife

## 2021-07-01 NOTE — Telephone Encounter (Signed)
Called pt regarding new ob appt. Pt no show. Left message requesting callback

## 2021-07-17 ENCOUNTER — Other Ambulatory Visit: Payer: Self-pay

## 2021-07-17 ENCOUNTER — Other Ambulatory Visit (HOSPITAL_COMMUNITY)
Admission: RE | Admit: 2021-07-17 | Discharge: 2021-07-17 | Disposition: A | Discharge status: Home / Self Care | Payer: Medicaid Other | Source: Ambulatory Visit | Attending: Certified Nurse Midwife | Admitting: Certified Nurse Midwife

## 2021-07-17 ENCOUNTER — Ambulatory Visit (INDEPENDENT_AMBULATORY_CARE_PROVIDER_SITE_OTHER): Payer: Medicaid Other | Admitting: Certified Nurse Midwife

## 2021-07-17 VITALS — BP 95/56 | HR 83 | Temp 98.3°F | Wt 144.2 lb

## 2021-07-17 DIAGNOSIS — Z3493 Encounter for supervision of normal pregnancy, unspecified, third trimester: Secondary | ICD-10-CM

## 2021-07-17 DIAGNOSIS — O0933 Supervision of pregnancy with insufficient antenatal care, third trimester: Secondary | ICD-10-CM | POA: Diagnosis not present

## 2021-07-17 DIAGNOSIS — N898 Other specified noninflammatory disorders of vagina: Secondary | ICD-10-CM

## 2021-07-17 DIAGNOSIS — Z3A31 31 weeks gestation of pregnancy: Secondary | ICD-10-CM | POA: Diagnosis not present

## 2021-07-17 NOTE — Progress Notes (Signed)
History:   Crystal Roach is a 20 y.o. G2P1001 at 2w2dby LMP being seen today for her first obstetrical visit.  Her obstetrical history is significant for  none . Patient does intend to breast feed. Pregnancy history fully reviewed.  Patient reports no complaints.      HISTORY: OB History  Gravida Para Term Preterm AB Living  '2 1 1 ' 0 0 1  SAB IAB Ectopic Multiple Live Births  0 0 0 0 1    # Outcome Date GA Lbr Len/2nd Weight Sex Delivery Anes PTL Lv  2 Current           1 Term 12/17/19 [id number]:57 / 00:39 6 lb 3.5 oz (2.821 kg) F Vag-Spont None  LIV     Birth Comments: WNL     Name: OLTIFFANNY, LAMARCHE   Apgar1: 9  Apgar5: 9    Pap not indicated due to age  Past Medical History:  Diagnosis Date   Chlamydia    Indication for care or intervention in labor or delivery 12/16/2019   Infection    UTI   Medical history non-contributory    Past Surgical History:  Procedure Laterality Date   NO PAST SURGERIES     Family History  Problem Relation Age of Onset   Seizures Father    Social History   Tobacco Use   Smoking status: Never   Smokeless tobacco: Never  Vaping Use   Vaping Use: Never used  Substance Use Topics   Alcohol use: Never   Drug use: Not Currently    Types: Marijuana    Comment: last was 3wk ago when found preg   No Known Allergies Current Outpatient Medications on File Prior to Visit  Medication Sig Dispense Refill   acetaminophen (TYLENOL) 325 MG tablet Take 2 tablets (650 mg total) by mouth every 4 (four) hours as needed (for pain scale < 4). (Patient not taking: No sig reported) 30 tablet 0   Blood Pressure Monitoring KIT Automatic blood pressure cuff with regular sized cuff. Blood pressure to be monitored regularly at home. ICD-10 code: O09.90. (Patient not taking: Reported on 07/17/2021) 1 kit 0   calcium carbonate (TUMS - DOSED IN MG ELEMENTAL CALCIUM) 500 MG chewable tablet Chew 1 tablet by mouth daily as needed for indigestion or  heartburn. (Patient not taking: No sig reported)     ibuprofen (ADVIL) 600 MG tablet Take 1 tablet (600 mg total) by mouth every 6 (six) hours. (Patient not taking: No sig reported) 30 tablet 0   Misc. Devices (GOJJI WEIGHT SCALE) MISC 1 Device by Does not apply route daily as needed. To weight self daily as needed at home. ICD-10 code: O0O64.90Patient not taking: Reported on 07/17/2021) 1 each 0   polyethylene glycol powder (GLYCOLAX/MIRALAX) 17 GM/SCOOP powder Take 17 g by mouth daily as needed (constipation). (Patient not taking: No sig reported) 255 g 0   Prenatal Vit-Fe Fumarate-FA (PREPLUS) 27-1 MG TABS Take 1 tablet by mouth daily. (Patient not taking: No sig reported) 30 tablet 13   Prenatal Vit-Fe Phos-FA-Omega (VITAFOL GUMMIES) 3.33-0.333-34.8 MG CHEW Chew 3 each by mouth daily. (Patient not taking: Reported on 07/17/2021) 90 tablet 12   terconazole (TERAZOL 7) 0.4 % vaginal cream Place 1 applicator vaginally at bedtime. Use for seven days (Patient not taking: Reported on 07/17/2021) 45 g 0   [DISCONTINUED] SPRINTEC 28 0.25-35 MG-MCG tablet Take 1 tablet by mouth daily. Not taking in one month  11   No current facility-administered medications on file prior to visit.   Review of Systems Pertinent items noted in HPI and remainder of comprehensive ROS otherwise negative. Physical Exam:   Vitals:   07/17/21 0856  BP: (!) 95/56  Pulse: 83  Temp: 98.3 F (36.8 C)  Weight: 144 lb 3.2 oz (65.4 kg)   Fetal Heart Rate (bpm): 139  Constitutional: Well-developed, well-nourished pregnant female in no acute distress.  HEENT: PERRLA Skin: normal color and turgor, no rash Cardiovascular: normal rate & rhythm, no murmur Respiratory: normal effort, lung sounds clear throughout GI: Abd soft, non-tender, pos BS x 4, gravid appropriate for gestational age MS: Extremities nontender, no edema, normal ROM Neurologic: Alert and oriented x 4.  GU: no CVA tenderness Pelvic: NEFG, copious thick, white  chunky discharge present in vagina and on vulva  Assessment & Plan:  1. Supervision of low-risk pregnancy, third trimester - Doing well overall, feeling regular and vigorous fetal movement  2. [redacted] weeks gestation of pregnancy - Routine OB care   3. Vaginal itching - Culture, OB Urine - Cervicovaginal ancillary only( [place])  4. Initial OB visit in third trimester (late to [facility]) Initial labs drawn. Continue prenatal vitamins. Problem list reviewed and updated. Genetic Screening discussed and reviewed Ultrasound discussed; fetal anatomic survey: ordered. Anticipatory guidance about prenatal visits given including labs, ultrasounds, and testing. Discussed usage of Babyscripts and virtual visits as additional source of managing and completing prenatal visits in midst of coronavirus and pandemic.   Encouraged to complete MyChart Registration for her ability to review results, send requests, and have questions addressed.  The nature of [facility] with multiple MDs and Advanced Practice Providers was explained to patient; also emphasized that residents, students are part of our team. Routine obstetric precautions reviewed. Encouraged to seek out care at office or emergency room [facility] preferred) for urgent and/or emergent concerns. Return in about 2 weeks (around 07/31/2021) for IN-PERSON, LOB.    Gaylan Gerold, MSN, CNM, [place] Certified Nurse Midwife, [facility]

## 2021-07-17 NOTE — Progress Notes (Signed)
Pt c/o vaginal itching.  PHQ0= 7 GAD7= 4 Pt declined counseling services at this time.

## 2021-07-20 LAB — CERVICOVAGINAL ANCILLARY ONLY
Bacterial Vaginitis (gardnerella): POSITIVE — AB
Candida Glabrata: NEGATIVE
Candida Vaginitis: POSITIVE — AB
Chlamydia: NEGATIVE
Comment: NEGATIVE
Comment: NEGATIVE
Comment: NEGATIVE
Comment: NEGATIVE
Comment: NEGATIVE
Comment: NORMAL
Neisseria Gonorrhea: NEGATIVE
Trichomonas: NEGATIVE

## 2021-07-20 LAB — URINE CULTURE, OB REFLEX

## 2021-07-20 LAB — CULTURE, OB URINE

## 2021-07-21 ENCOUNTER — Other Ambulatory Visit: Payer: Self-pay

## 2021-07-21 ENCOUNTER — Other Ambulatory Visit (INDEPENDENT_AMBULATORY_CARE_PROVIDER_SITE_OTHER): Payer: Medicaid Other

## 2021-07-21 DIAGNOSIS — Z3A31 31 weeks gestation of pregnancy: Secondary | ICD-10-CM

## 2021-07-21 DIAGNOSIS — Z3493 Encounter for supervision of normal pregnancy, unspecified, third trimester: Secondary | ICD-10-CM

## 2021-07-21 DIAGNOSIS — Z3480 Encounter for supervision of other normal pregnancy, unspecified trimester: Secondary | ICD-10-CM

## 2021-07-21 MED ORDER — NUVESSA 1.3 % VA GEL: 1.0000 | Freq: Once | VAGINAL | 0 refills | Status: AC

## 2021-07-21 MED ORDER — TERCONAZOLE 0.4 % VA CREA: 1.0000 | TOPICAL_CREAM | Freq: Every day | VAGINAL | 0 refills | Status: DC

## 2021-07-21 NOTE — Addendum Note (Signed)
Addended by: Edd Arbour on: 07/21/2021 03:43 AM   Modules accepted: Orders

## 2021-07-21 NOTE — Progress Notes (Signed)
   Patient in clinic for 28 week labs. Declined Flu vaccine today. Desires to wait until next visit.  Clovis Pu, RN

## 2021-07-22 LAB — CBC
Hematocrit: 28.5 % — ABNORMAL LOW (ref 34.0–46.6)
Hemoglobin: 9.5 g/dL — ABNORMAL LOW (ref 11.1–15.9)
MCH: 26.9 pg (ref 26.6–33.0)
MCHC: 33.3 g/dL (ref 31.5–35.7)
MCV: 81 fL (ref 79–97)
Platelets: 206 10*3/uL (ref 150–450)
RBC: 3.53 x10E6/uL — ABNORMAL LOW (ref 3.77–5.28)
RDW: 13.1 % (ref 11.7–15.4)
WBC: 10.6 10*3/uL (ref 3.4–10.8)

## 2021-07-22 LAB — HIV ANTIBODY (ROUTINE TESTING W REFLEX): HIV Screen 4th Generation wRfx: NONREACTIVE

## 2021-07-22 LAB — GLUCOSE TOLERANCE, 2 HOURS W/ 1HR
Glucose, 1 hour: 136 mg/dL (ref 70–179)
Glucose, 2 hour: 70 mg/dL (ref 70–152)
Glucose, Fasting: 65 mg/dL — ABNORMAL LOW (ref 70–91)

## 2021-07-22 LAB — RPR: RPR Ser Ql: NONREACTIVE

## 2021-07-31 ENCOUNTER — Ambulatory Visit (INDEPENDENT_AMBULATORY_CARE_PROVIDER_SITE_OTHER): Payer: Medicaid Other | Admitting: Certified Nurse Midwife

## 2021-07-31 ENCOUNTER — Other Ambulatory Visit: Payer: Self-pay

## 2021-07-31 VITALS — BP 97/61 | HR 80 | Temp 97.4°F | Wt 144.0 lb

## 2021-07-31 DIAGNOSIS — Z3A33 33 weeks gestation of pregnancy: Secondary | ICD-10-CM | POA: Diagnosis not present

## 2021-07-31 DIAGNOSIS — O2343 Unspecified infection of urinary tract in pregnancy, third trimester: Secondary | ICD-10-CM | POA: Diagnosis not present

## 2021-07-31 DIAGNOSIS — D508 Other iron deficiency anemias: Secondary | ICD-10-CM

## 2021-07-31 DIAGNOSIS — Z3493 Encounter for supervision of normal pregnancy, unspecified, third trimester: Secondary | ICD-10-CM

## 2021-07-31 DIAGNOSIS — Z23 Encounter for immunization: Secondary | ICD-10-CM

## 2021-07-31 MED ORDER — FERROUS SULFATE 325 (65 FE) MG PO TABS: 325.0000 mg | ORAL_TABLET | ORAL | 3 refills | Status: DC

## 2021-07-31 MED ORDER — CEFADROXIL 500 MG PO CAPS: 500.0000 mg | ORAL_CAPSULE | Freq: Two times a day (BID) | ORAL | 0 refills | Status: DC

## 2021-07-31 NOTE — Progress Notes (Signed)
   PRENATAL VISIT NOTE  Subjective:  Crystal Roach is a 20 y.o. G2P1001 at [redacted]w[redacted]d being seen today for ongoing prenatal care.  She is currently monitored for the following issues for this low-risk pregnancy and has Gastroesophageal reflux disease without esophagitis; Tail bone pain; Candida vaginitis; and Supervision of other normal pregnancy, antepartum on their problem list.  Patient reports no complaints.  Contractions: Not present. Vag. Bleeding: None.  Movement: Present. Denies leaking of fluid.   The following portions of the patient's history were reviewed and updated as appropriate: allergies, current medications, past family history, past medical history, past social history, past surgical history and problem list.   Objective:   Vitals:   07/31/21 0949  BP: 97/61  Pulse: 80  Temp: (!) 97.4 F (36.3 C)  Weight: 144 lb (65.3 kg)    Fetal Status: Fetal Heart Rate (bpm): 161 Fundal Height: 32 cm Movement: Present  Presentation: Vertex  General:  Alert, oriented and cooperative. Patient is in no acute distress.  Skin: Skin is warm and dry. No rash noted.   Cardiovascular: Normal heart rate noted  Respiratory: Normal respiratory effort, no problems with respiration noted  Abdomen: Soft, gravid, appropriate for gestational age.  Pain/Pressure: Absent     Pelvic: Cervical exam deferred        Extremities: Normal range of motion.  Edema: None  Mental Status: Normal mood and affect. Normal behavior. Normal judgment and thought content.   Assessment and Plan:  Pregnancy: G2P1001 at [redacted]w[redacted]d 1. Supervision of low-risk pregnancy, third trimester - Doing well, feeling regular and vigorous fetal movement  - [place] MFM OB COMP + 14 WK; Future  2. [redacted] weeks gestation of pregnancy - Routine OB care including anticipatory guidance on GBS testing at next visit and management if it comes back positive.  3. Urinary tract infection in mother during third trimester of pregnancy - cefadroxil  (DURICEF) 500 MG capsule; Take 1 capsule (500 mg total) by mouth 2 (two) times daily.  Dispense: 10 capsule; Refill: 0  4. Need for immunization against influenza - Flu Vaccine QUAD 28mo+IM (Fluarix, Fluzone & Alfiuria Quad PF)  5. Iron deficiency anemia in pregnancy - Reviewed iron rich foods to include in diet and added info to AVS - Offered iron infusion vs tablets, pt prefers tablets taken every other day. Ferrous sulfate sent to pharmacy.  Preterm labor symptoms and general obstetric precautions including but not limited to vaginal bleeding, contractions, leaking of fluid and fetal movement were reviewed in detail with the patient. Please refer to After Visit Summary for other counseling recommendations.   Return in about 3 weeks (around 08/21/2021) for IN-PERSON, LOB/GBS.  Future Appointments  Date Time Provider Department Center  08/18/2021  1:45 PM [facility] US5 [facility]  08/26/2021  8:50 AM Grayce Sessions, NP [facility] None  09/02/2021 11:15 AM Leftwich-Kirby, Wilmer Floor, CNM [facility] None  09/09/2021 10:15 AM Armando Reichert, CNM [facility] None  09/16/2021  9:55 AM Raelyn Mora, CNM [facility] None    Bernerd Limbo, CNM

## 2021-08-18 ENCOUNTER — Other Ambulatory Visit: Payer: Self-pay | Admitting: Certified Nurse Midwife

## 2021-08-18 ENCOUNTER — Ambulatory Visit: Payer: Medicaid Other | Attending: Certified Nurse Midwife

## 2021-08-18 ENCOUNTER — Other Ambulatory Visit: Payer: Self-pay

## 2021-08-18 ENCOUNTER — Other Ambulatory Visit: Payer: Self-pay | Admitting: *Deleted

## 2021-08-18 DIAGNOSIS — Z3493 Encounter for supervision of normal pregnancy, unspecified, third trimester: Secondary | ICD-10-CM

## 2021-08-26 ENCOUNTER — Ambulatory Visit (INDEPENDENT_AMBULATORY_CARE_PROVIDER_SITE_OTHER): Payer: Medicaid Other | Admitting: Primary Care

## 2021-09-02 ENCOUNTER — Encounter: Payer: Medicaid Other | Admitting: Advanced Practice Midwife

## 2021-09-02 NOTE — Progress Notes (Deleted)
   PRENATAL VISIT NOTE  Subjective:  Crystal Roach is a 20 y.o. G2P1001 at [redacted]w[redacted]d being seen today for ongoing prenatal care.  She is currently monitored for the following issues for this {Blank single:19197::"high-risk","low-risk"} pregnancy and has Gastroesophageal reflux disease without esophagitis; Tail bone pain; Candida vaginitis; and Supervision of other normal pregnancy, antepartum on their problem list.  Patient reports {sx:14538}.   .  .   . Denies leaking of fluid.   The following portions of the patient's history were reviewed and updated as appropriate: allergies, current medications, past family history, past medical history, past social history, past surgical history and problem list.   Objective:  There were no vitals filed for this visit.  Fetal Status:           General:  Alert, oriented and cooperative. Patient is in no acute distress.  Skin: Skin is warm and dry. No rash noted.   Cardiovascular: Normal heart rate noted  Respiratory: Normal respiratory effort, no problems with respiration noted  Abdomen: Soft, gravid, appropriate for gestational age.        Pelvic: {Blank single:19197::"Cervical exam performed in the presence of a chaperone","Cervical exam deferred"}        Extremities: Normal range of motion.     Mental Status: Normal mood and affect. Normal behavior. Normal judgment and thought content.   Assessment and Plan:  Pregnancy: G2P1001 at [redacted]w[redacted]d 1. Supervision of low-risk pregnancy, third trimester ***  2. [redacted] weeks gestation of pregnancy ***  3. Urinary tract infection in mother during third trimester of pregnancy --UTI tx 10/21. TOC today.  {Blank single:19197::"Term","Preterm"} labor symptoms and general obstetric precautions including but not limited to vaginal bleeding, contractions, leaking of fluid and fetal movement were reviewed in detail with the patient. Please refer to After Visit Summary for other counseling recommendations.   No  follow-ups on file.  Future Appointments  Date Time Provider Department Center  09/02/2021 11:15 AM Leftwich-Kirby, Wilmer Floor, CNM [facility] None  09/09/2021 10:15 AM Armando Reichert, CNM [facility] None  09/10/2021  3:30 PM [facility] NURSE [facility]  09/10/2021  3:45 PM [facility] US6 [facility]  09/16/2021  9:55 AM Raelyn Mora, CNM [facility] None    Sharen Counter, CNM

## 2021-09-09 ENCOUNTER — Other Ambulatory Visit: Payer: Self-pay

## 2021-09-09 ENCOUNTER — Ambulatory Visit (INDEPENDENT_AMBULATORY_CARE_PROVIDER_SITE_OTHER): Payer: Medicaid Other | Admitting: Advanced Practice Midwife

## 2021-09-09 ENCOUNTER — Other Ambulatory Visit (HOSPITAL_COMMUNITY)
Admission: RE | Admit: 2021-09-09 | Discharge: 2021-09-09 | Disposition: A | Discharge status: Home / Self Care | Payer: Medicaid Other | Source: Ambulatory Visit | Attending: Advanced Practice Midwife | Admitting: Advanced Practice Midwife

## 2021-09-09 ENCOUNTER — Encounter: Payer: Self-pay | Admitting: Advanced Practice Midwife

## 2021-09-09 VITALS — BP 120/75 | HR 73 | Temp 97.3°F | Wt 153.8 lb

## 2021-09-09 DIAGNOSIS — Z3480 Encounter for supervision of other normal pregnancy, unspecified trimester: Secondary | ICD-10-CM | POA: Diagnosis not present

## 2021-09-09 DIAGNOSIS — Z3A39 39 weeks gestation of pregnancy: Secondary | ICD-10-CM

## 2021-09-09 NOTE — Progress Notes (Signed)
   PRENATAL VISIT NOTE  Subjective:  Crystal Roach is a 20 y.o. G2P1001 at [redacted]w[redacted]d being seen today for ongoing prenatal care.  She is currently monitored for the following issues for this low-risk pregnancy and has Gastroesophageal reflux disease without esophagitis; Tail bone pain; Candida vaginitis; and Supervision of other normal pregnancy, antepartum on their problem list.  Patient reports no complaints.  Contractions: Irregular. Vag. Bleeding: None.  Movement: Present. Denies leaking of fluid.   The following portions of the patient's history were reviewed and updated as appropriate: allergies, current medications, past family history, past medical history, past social history, past surgical history and problem list.   Objective:   Vitals:   09/09/21 1035  BP: 120/75  Pulse: 73  Temp: (!) 97.3 F (36.3 C)  Weight: 69.8 kg    Fetal Status: Fetal Heart Rate (bpm): 120 Fundal Height: 36 cm Movement: Present     General:  Alert, oriented and cooperative. Patient is in no acute distress.  Skin: Skin is warm and dry. No rash noted.   Cardiovascular: Normal heart rate noted  Respiratory: Normal respiratory effort, no problems with respiration noted  Abdomen: Soft, gravid, appropriate for gestational age.  Pain/Pressure: Present     Pelvic: Cervical exam performed in the presence of a chaperone Dilation: Fingertip Effacement (%): Thick Station: -3  Extremities: Normal range of motion.  Edema: None  Mental Status: Normal mood and affect. Normal behavior. Normal judgment and thought content.   Assessment and Plan:  Pregnancy: G2P1001 at [redacted]w[redacted]d 1. Supervision of other normal pregnancy, antepartum - MFM Changed EDC based on [place] from 08/19/2021. Patient has a FU [place] tomorrow to confirm EDC/fetal growth.   2. [redacted] weeks gestation of pregnancy - Cervicovaginal ancillary only - Culture, beta strep (group b only)  Term labor symptoms and general obstetric precautions including but not  limited to vaginal bleeding, contractions, leaking of fluid and fetal movement were reviewed in detail with the patient. Please refer to After Visit Summary for other counseling recommendations.   Return in about 1 week (around 09/16/2021).  Future Appointments  Date Time Provider Department Center  09/10/2021  3:30 PM [facility] NURSE [facility]  09/10/2021  3:45 PM [facility] US6 [facility]  09/16/2021  9:55 AM Raelyn Mora, CNM [facility] None   . Thressa Sheller DNP, CNM  09/09/21  11:14 AM

## 2021-09-10 ENCOUNTER — Ambulatory Visit: Payer: Medicaid Other | Attending: Maternal & Fetal Medicine

## 2021-09-10 ENCOUNTER — Ambulatory Visit: Payer: Medicaid Other | Admitting: *Deleted

## 2021-09-10 VITALS — BP 104/64 | HR 100

## 2021-09-10 DIAGNOSIS — Z148 Genetic carrier of other disease: Secondary | ICD-10-CM | POA: Diagnosis not present

## 2021-09-10 DIAGNOSIS — Z362 Encounter for other antenatal screening follow-up: Secondary | ICD-10-CM | POA: Diagnosis not present

## 2021-09-10 DIAGNOSIS — O0933 Supervision of pregnancy with insufficient antenatal care, third trimester: Secondary | ICD-10-CM | POA: Insufficient documentation

## 2021-09-10 DIAGNOSIS — Z3A36 36 weeks gestation of pregnancy: Secondary | ICD-10-CM | POA: Diagnosis not present

## 2021-09-10 DIAGNOSIS — Z3493 Encounter for supervision of normal pregnancy, unspecified, third trimester: Secondary | ICD-10-CM

## 2021-09-10 DIAGNOSIS — Z3480 Encounter for supervision of other normal pregnancy, unspecified trimester: Secondary | ICD-10-CM | POA: Diagnosis present

## 2021-09-10 LAB — CERVICOVAGINAL ANCILLARY ONLY
Chlamydia: NEGATIVE
Comment: NEGATIVE
Comment: NEGATIVE
Comment: NORMAL
Neisseria Gonorrhea: NEGATIVE
Trichomonas: NEGATIVE

## 2021-09-10 NOTE — Progress Notes (Signed)
104/64

## 2021-09-13 LAB — CULTURE, BETA STREP (GROUP B ONLY): Strep Gp B Culture: NEGATIVE

## 2021-09-16 ENCOUNTER — Encounter: Payer: Medicaid Other | Admitting: Obstetrics and Gynecology

## 2021-09-24 ENCOUNTER — Inpatient Hospital Stay (HOSPITAL_COMMUNITY): Payer: Medicaid Other | Admitting: Anesthesiology

## 2021-09-24 ENCOUNTER — Inpatient Hospital Stay (HOSPITAL_COMMUNITY)
Admission: AD | Admit: 2021-09-24 | Discharge: 2021-09-25 | DRG: 807 | Disposition: A | Discharge status: Home / Self Care | Payer: Medicaid Other | Attending: Obstetrics and Gynecology | Admitting: Obstetrics and Gynecology

## 2021-09-24 ENCOUNTER — Other Ambulatory Visit: Payer: Self-pay

## 2021-09-24 ENCOUNTER — Encounter (HOSPITAL_COMMUNITY): Payer: Self-pay | Admitting: Obstetrics & Gynecology

## 2021-09-24 DIAGNOSIS — D563 Thalassemia minor: Secondary | ICD-10-CM | POA: Diagnosis present

## 2021-09-24 DIAGNOSIS — O26893 Other specified pregnancy related conditions, third trimester: Secondary | ICD-10-CM | POA: Diagnosis present

## 2021-09-24 DIAGNOSIS — Z20822 Contact with and (suspected) exposure to covid-19: Secondary | ICD-10-CM | POA: Diagnosis present

## 2021-09-24 DIAGNOSIS — Z3A38 38 weeks gestation of pregnancy: Secondary | ICD-10-CM

## 2021-09-24 DIAGNOSIS — Z3A36 36 weeks gestation of pregnancy: Secondary | ICD-10-CM

## 2021-09-24 HISTORY — DX: Anemia, unspecified: D64.9

## 2021-09-24 LAB — CBC
HCT: 31.3 % — ABNORMAL LOW (ref 36.0–46.0)
Hemoglobin: 10.2 g/dL — ABNORMAL LOW (ref 12.0–15.0)
MCH: 26.1 pg (ref 26.0–34.0)
MCHC: 32.6 g/dL (ref 30.0–36.0)
MCV: 80.1 fL (ref 80.0–100.0)
Platelets: 229 10*3/uL (ref 150–400)
RBC: 3.91 MIL/uL (ref 3.87–5.11)
RDW: 16 % — ABNORMAL HIGH (ref 11.5–15.5)
WBC: 14.2 10*3/uL — ABNORMAL HIGH (ref 4.0–10.5)
nRBC: 0 % (ref 0.0–0.2)

## 2021-09-24 LAB — TYPE AND SCREEN
ABO/RH(D): A POS
Antibody Screen: NEGATIVE

## 2021-09-24 LAB — RPR: RPR Ser Ql: NONREACTIVE

## 2021-09-24 LAB — RESP PANEL BY RT-PCR (FLU A&B, COVID) ARPGX2
Influenza A by PCR: NEGATIVE
Influenza B by PCR: NEGATIVE
SARS Coronavirus 2 by RT PCR: NEGATIVE

## 2021-09-24 MED ORDER — IBUPROFEN 600 MG PO TABS
600.0000 mg | ORAL_TABLET | Freq: Four times a day (QID) | ORAL | Status: DC
  Administered 2021-09-24 – 2021-09-25 (×4): 600 mg via ORAL
  Filled 2021-09-24 (×4): qty 1

## 2021-09-24 MED ORDER — OXYCODONE-ACETAMINOPHEN 5-325 MG PO TABS: 1.0000 | ORAL_TABLET | ORAL | Status: DC | PRN

## 2021-09-24 MED ORDER — OXYTOCIN-SODIUM CHLORIDE 30-0.9 UT/500ML-% IV SOLN
2.5000 [IU]/h | INTRAVENOUS | Status: DC
  Filled 2021-09-24: qty 500

## 2021-09-24 MED ORDER — FLEET ENEMA 7-19 GM/118ML RE ENEM: 1.0000 | ENEMA | RECTAL | Status: DC | PRN

## 2021-09-24 MED ORDER — EPHEDRINE 5 MG/ML INJ: 10.0000 mg | INTRAVENOUS | Status: DC | PRN

## 2021-09-24 MED ORDER — LACTATED RINGERS IV SOLN: 500.0000 mL | Freq: Once | INTRAVENOUS | Status: DC

## 2021-09-24 MED ORDER — COCONUT OIL OIL: 1.0000 "application " | TOPICAL_OIL | Status: DC | PRN

## 2021-09-24 MED ORDER — FENTANYL CITRATE (PF) 100 MCG/2ML IJ SOLN
50.0000 ug | INTRAMUSCULAR | Status: DC | PRN
Start: 2021-09-24 — End: 2021-09-24

## 2021-09-24 MED ORDER — ONDANSETRON HCL 4 MG/2ML IJ SOLN: 4.0000 mg | Freq: Four times a day (QID) | INTRAMUSCULAR | Status: DC | PRN

## 2021-09-24 MED ORDER — DIPHENHYDRAMINE HCL 50 MG/ML IJ SOLN: 12.5000 mg | INTRAMUSCULAR | Status: DC | PRN

## 2021-09-24 MED ORDER — ACETAMINOPHEN 325 MG PO TABS: 650.0000 mg | ORAL_TABLET | ORAL | Status: DC | PRN

## 2021-09-24 MED ORDER — OXYCODONE-ACETAMINOPHEN 5-325 MG PO TABS: 2.0000 | ORAL_TABLET | ORAL | Status: DC | PRN

## 2021-09-24 MED ORDER — OXYTOCIN BOLUS FROM INFUSION
333.0000 mL | Freq: Once | INTRAVENOUS | Status: AC
  Administered 2021-09-24: 333 mL via INTRAVENOUS

## 2021-09-24 MED ORDER — DIBUCAINE (PERIANAL) 1 % EX OINT: 1.0000 "application " | TOPICAL_OINTMENT | CUTANEOUS | Status: DC | PRN

## 2021-09-24 MED ORDER — LACTATED RINGERS IV SOLN
INTRAVENOUS | Status: DC
  Administered 2021-09-24: 125 mL/h via INTRAVENOUS

## 2021-09-24 MED ORDER — LIDOCAINE HCL (PF) 1 % IJ SOLN
INTRAMUSCULAR | Status: DC | PRN
  Administered 2021-09-24: 6 mL via EPIDURAL

## 2021-09-24 MED ORDER — ONDANSETRON HCL 4 MG/2ML IJ SOLN: 4.0000 mg | INTRAMUSCULAR | Status: DC | PRN

## 2021-09-24 MED ORDER — LACTATED RINGERS IV SOLN
500.0000 mL | INTRAVENOUS | Status: DC | PRN
  Administered 2021-09-24: 500 mL via INTRAVENOUS

## 2021-09-24 MED ORDER — FENTANYL CITRATE (PF) 100 MCG/2ML IJ SOLN
100.0000 ug | INTRAMUSCULAR | Status: DC | PRN
  Administered 2021-09-24: 100 ug via INTRAVENOUS
  Filled 2021-09-24: qty 2

## 2021-09-24 MED ORDER — ONDANSETRON HCL 4 MG PO TABS: 4.0000 mg | ORAL_TABLET | ORAL | Status: DC | PRN

## 2021-09-24 MED ORDER — TETANUS-DIPHTH-ACELL PERTUSSIS 5-2.5-18.5 LF-MCG/0.5 IM SUSY: 0.5000 mL | PREFILLED_SYRINGE | Freq: Once | INTRAMUSCULAR | Status: DC

## 2021-09-24 MED ORDER — FENTANYL-BUPIVACAINE-NACL 0.5-0.125-0.9 MG/250ML-% EP SOLN
12.0000 mL/h | EPIDURAL | Status: DC | PRN
  Administered 2021-09-24: 12 mL/h via EPIDURAL
  Filled 2021-09-24: qty 250

## 2021-09-24 MED ORDER — PRENATAL MULTIVITAMIN CH
1.0000 | ORAL_TABLET | Freq: Every day | ORAL | Status: DC
  Administered 2021-09-24 – 2021-09-25 (×2): 1 via ORAL
  Filled 2021-09-24 (×2): qty 1

## 2021-09-24 MED ORDER — SENNOSIDES-DOCUSATE SODIUM 8.6-50 MG PO TABS
2.0000 | ORAL_TABLET | ORAL | Status: DC
  Administered 2021-09-24: 2 via ORAL
  Filled 2021-09-24: qty 2

## 2021-09-24 MED ORDER — WITCH HAZEL-GLYCERIN EX PADS: 1.0000 "application " | MEDICATED_PAD | CUTANEOUS | Status: DC | PRN

## 2021-09-24 MED ORDER — BENZOCAINE-MENTHOL 20-0.5 % EX AERO
1.0000 "application " | INHALATION_SPRAY | CUTANEOUS | Status: DC | PRN
  Administered 2021-09-24: 1 via TOPICAL
  Filled 2021-09-24: qty 56

## 2021-09-24 MED ORDER — LIDOCAINE HCL (PF) 1 % IJ SOLN: 30.0000 mL | INTRAMUSCULAR | Status: DC | PRN

## 2021-09-24 MED ORDER — PHENYLEPHRINE 40 MCG/ML (10ML) SYRINGE FOR IV PUSH (FOR BLOOD PRESSURE SUPPORT)
80.0000 ug | PREFILLED_SYRINGE | INTRAVENOUS | Status: DC | PRN
  Filled 2021-09-24: qty 10

## 2021-09-24 MED ORDER — MEASLES, MUMPS & RUBELLA VAC IJ SOLR: 0.5000 mL | Freq: Once | INTRAMUSCULAR | Status: DC

## 2021-09-24 MED ORDER — PHENYLEPHRINE 40 MCG/ML (10ML) SYRINGE FOR IV PUSH (FOR BLOOD PRESSURE SUPPORT)
80.0000 ug | PREFILLED_SYRINGE | INTRAVENOUS | Status: DC | PRN
  Administered 2021-09-24: 80 ug via INTRAVENOUS

## 2021-09-24 MED ORDER — SOD CITRATE-CITRIC ACID 500-334 MG/5ML PO SOLN: 30.0000 mL | ORAL | Status: DC | PRN

## 2021-09-24 MED ORDER — SIMETHICONE 80 MG PO CHEW: 80.0000 mg | CHEWABLE_TABLET | ORAL | Status: DC | PRN

## 2021-09-24 MED ORDER — DIPHENHYDRAMINE HCL 25 MG PO CAPS: 25.0000 mg | ORAL_CAPSULE | Freq: Four times a day (QID) | ORAL | Status: DC | PRN

## 2021-09-24 NOTE — H&P (Addendum)
OBSTETRIC ADMISSION HISTORY AND PHYSICAL  Crystal Roach is a 20 y.o. female G2P1001 with IUP at [redacted]w[redacted]d by 3rd trimester [place] presenting for SOL. She reports +FMs, No LOF, no VB, no blurry vision, headaches or peripheral edema, and RUQ pain.  She plans on breast feeding. She request POPs for birth control.  She received her prenatal [facility] at [facility]   Dating: By 3rd trimester [place] --->  Estimated Date of Delivery: 10/02/21  Sono:    @[redacted]w[redacted]d , CWD, normal anatomy, cephalic presentation, anterior placental lie, 3360g, 83% EFW   Prenatal History/Complications:  --Silent carrier for alpha thalassemia   Past Medical History: Past Medical History:  Diagnosis Date   Anemia    Chlamydia    Indication for care or intervention in labor or delivery 12/16/2019   Infection    UTI   Medical history non-contributory     Past Surgical History: Past Surgical History:  Procedure Laterality Date   NO PAST SURGERIES      Obstetrical History: OB History     Gravida  2   Para  1   Term  1   Preterm      AB      Living  1      SAB      IAB      Ectopic      Multiple  0   Live Births  1           Social History Social History   Socioeconomic History   Marital status: Significant Other    Spouse name: Not on file   Number of children: 1   Years of education: High school   Highest education level: High school graduate  Occupational History   Occupation: 02/15/2020   Occupation: unemployed  Tobacco Use   Smoking status: Never   Smokeless tobacco: Never  Vaping Use   Vaping Use: Never used  Substance and Sexual Activity   Alcohol use: Never   Drug use: Not Currently    Types: Marijuana    Comment: last was 3wk ago when found preg   Sexual activity: Yes    Birth control/protection: None  Other Topics Concern   Not on file  Social History Narrative   Patient declined to answer questions.    Social Determinants of Health   Financial Resource Strain: Not on file   Food Insecurity: Not on file  Transportation Needs: Not on file  Physical Activity: Not on file  Stress: Not on file  Social Connections: Not on file    Family History: Family History  Problem Relation Age of Onset   Seizures Father     Allergies: No Known Allergies  Medications Prior to Admission  Medication Sig Dispense Refill Last Dose   ferrous sulfate 325 (65 FE) MG tablet Take 325 mg by mouth daily with breakfast.   09/23/2021     Review of Systems   All systems reviewed and negative except as stated in HPI  Blood pressure 110/75, pulse 85, temperature 98.9 F (37.2 C), temperature source Oral, resp. rate 16, last menstrual period 12/10/2020, SpO2 97 %, currently breastfeeding. General appearance: alert, cooperative, and no distress Lungs: normal work of breathing Heart: warm and well perfused Abdomen: soft, non-tender; gravid uterus Extremities: No edema or calf tenderness. Presentation: cephalic Fetal monitoring: 130/mod/+accels/none Uterine activity: irregular contractions Dilation: 5.5 Effacement (%): 80 Station: -1 Exam by:: [email or web address], RN   Prenatal labs: ABO, Rh: A/Positive/-- (05/17 1034) Antibody: Negative (05/17 1034) Rubella:  4.22 (05/17 1034) RPR: Non Reactive (10/11 0854)  HBsAg: Negative (05/17 1034)  HIV: Non Reactive (10/11 0854)  GBS: Negative/-- (11/30 1113)  2 hr Glucola: Normal Genetic screening: LR NIPS, Alpha Thal silent carrier Psychologist, counselling) Anatomy US: Normal  Prenatal Transfer Tool  Maternal Diabetes: No Genetic Screening: LR NIPS, Alpha Thal silent carrier ([facility]) Maternal Ultrasounds/Referrals: Normal Fetal Ultrasounds or other Referrals:  None Maternal Substance Abuse:  No Significant Maternal Medications:  None Significant Maternal Lab Results: Group B Strep negative  No results found for this or any previous visit (from the past 24 hour(s)).  Patient Active Problem List   Diagnosis Date Noted   Supervision of  other normal pregnancy, antepartum 02/24/2021   Candida vaginitis 11/23/2019   Gastroesophageal reflux disease without esophagitis 08/01/2019   Tail bone pain 08/01/2019    Assessment/Plan:  Crystal Roach is a 20 y.o. G2P1001 at [redacted]w[redacted]d here for SOL.  #Labor: Expectant management as she has made quick change while being in the MAU briefly. Anticipate vaginal delivery. #Pain: Requests epidural #FWB: Cat 1 #ID: GBS neg #MOF: breast/pump #MOC: POPs   Angela Cox, MD  09/24/2021, 5:42 AM  GME ATTESTATION:  I saw and evaluated the patient. I agree with the findings and the plan of care as documented in the residents note.  Leticia Penna, DO OB Fellow, Faculty [facility], Center for [facility] 09/24/2021 8:29 AM

## 2021-09-24 NOTE — Discharge Summary (Addendum)
Postpartum Discharge Summary     Patient Name: Crystal Roach DOB: 16-May-2001 MRN: [id number]  Date of admission: 09/24/2021 Delivery date:09/24/2021  Delivering provider: Layla Barter  Date of discharge: 09/25/2021  Admitting diagnosis: Normal labor and delivery [O80] Intrauterine pregnancy: [redacted]w[redacted]d    Secondary diagnosis:  Principal Problem:   Normal labor and delivery   Additional problems: None    Discharge diagnosis: Term Pregnancy Delivered                                              Post partum procedures: None Augmentation: AROM Complications: None  Hospital course: Onset of Labor With Vaginal Delivery      20y.o. yo G2P1001 at 353w6das admitted in Active Labor on 09/24/2021. Patient had an uncomplicated labor course as follows:  Membrane Rupture Time/Date: 10:06 AM ,09/24/2021   Delivery Method:Vaginal, Spontaneous  Episiotomy: None  Lacerations:  1st degree;Perineal  Patient had an uncomplicated postpartum course.  She is ambulating, tolerating a regular diet, passing flatus, and urinating well. Patient is discharged home in stable condition on 09/25/21.  Newborn Data: Birth date:09/24/2021  Birth time:11:21 AM  Gender:Female  Living status:Living  Apgars:8 ,9  Weight:2735 g   Magnesium Sulfate received: No BMZ received: No Rhophylac:N/A MMR:No T-DaP: Declined Flu: N/A Transfusion:No  Physical exam  Vitals:   09/24/21 1425 09/24/21 1828 09/24/21 2312 09/25/21 0524  BP: 104/75 101/67 104/76 103/72  Pulse: 63 62 70 65  Resp: _0 Temp: 97.8 F (36.6 C) 98.2 F (36.8 C) 98.4 F (36.9 C) 98 F (36.7 C)  TempSrc: Oral Oral Oral Axillary  SpO2: 100%     Weight:      Height:       General: alert, cooperative, and no distress Lochia: appropriate Uterine Fundus: firm Incision: N/A DVT Evaluation: No evidence of DVT seen on physical exam. Labs: Lab Results  Component Value Date   WBC 14.2 (H) 09/24/2021   HGB 10.2 (L)  09/24/2021   HCT 31.3 (L) 09/24/2021   MCV 80.1 09/24/2021   PLT 229 09/24/2021   No flowsheet data found. Edinburgh Score: Edinburgh Postnatal Depression Scale Screening Tool 12/19/2019  I have been able to laugh and see the funny side of things. 0  I have looked forward with enjoyment to things. 0  I have blamed myself unnecessarily when things went wrong. 1  I have been anxious or worried for no good reason. 2  I have felt scared or panicky for no good reason. 1  Things have been getting on top of me. 1  I have been so unhappy that I have had difficulty sleeping. 0  I have felt sad or miserable. 1  I have been so unhappy that I have been crying. 1  The thought of harming myself has occurred to me. 0  Edinburgh Postnatal Depression Scale Total 7     After visit meds:  Allergies as of 09/25/2021   No Known Allergies      Medication List     TAKE these medications    ferrous sulfate 325 (65 FE) MG tablet Take 325 mg by mouth daily with breakfast.   ibuprofen 600 MG tablet Commonly known as: ADVIL Take 1 tablet (600 mg total) by mouth every 6 (six) hours.   Slynd 4 MG Tabs Generic drug:  Drospirenone Take 1 tablet by mouth daily.         Discharge home in stable condition Infant Feeding: Breast Infant Disposition:home with mother Discharge instruction: per After Visit Summary and Postpartum booklet. Activity: Advance as tolerated. Pelvic rest for 6 weeks.  Diet: routine diet Future Appointments: Future Appointments  Date Time Provider [place]  09/25/2021 10:55 AM Laury Deep, CNM [facility] None   Follow up Visit: Message sent to office on 09/24/21  Follow-up Information     Carmen. Schedule an appointment as soon as possible for a visit in 4 week(s).   Specialty: Obstetrics and Gynecology Contact information: [place] [phone]               Please  schedule this patient for a In person postpartum visit in 6 weeks with the following provider: APP. Additional Postpartum F/U: none   Low risk pregnancy complicated by:  None Delivery mode:  Vaginal, Spontaneous  Anticipated Birth Control:  POPs   09/25/2021 Hansel Feinstein, CNM

## 2021-09-24 NOTE — Anesthesia Preprocedure Evaluation (Signed)
Anesthesia Evaluation  Patient identified by MRN, date of birth, ID band Patient awake    Reviewed: Allergy & Precautions, NPO status , Patient's Chart, lab work & pertinent test results  Airway Mallampati: II  TM Distance: >3 FB Neck ROM: Full    Dental no notable dental hx.    Pulmonary neg pulmonary ROS,    Pulmonary exam normal breath sounds clear to auscultation       Cardiovascular negative cardio ROS Normal cardiovascular exam Rhythm:Regular Rate:Normal     Neuro/Psych negative neurological ROS  negative psych ROS   GI/Hepatic Neg liver ROS, GERD  Medicated,  Endo/Other  negative endocrine ROS  Renal/GU negative Renal ROS  negative genitourinary   Musculoskeletal negative musculoskeletal ROS (+)   Abdominal   Peds negative pediatric ROS (+)  Hematology  (+) anemia ,   Anesthesia Other Findings   Reproductive/Obstetrics (+) Pregnancy                             Anesthesia Physical Anesthesia Plan  ASA: 2  Anesthesia Plan: Epidural   Post-op Pain Management:    Induction:   PONV Risk Score and Plan: 2 and Treatment may vary due to age or medical condition  Airway Management Planned: Natural Airway  Additional Equipment: None  Intra-op Plan:   Post-operative Plan:   Informed Consent: I have reviewed the patients History and Physical, chart, labs and discussed the procedure including the risks, benefits and alternatives for the proposed anesthesia with the patient or authorized representative who has indicated his/her understanding and acceptance.       Plan Discussed with: CRNA and Anesthesiologist  Anesthesia Plan Comments:         Anesthesia Quick Evaluation

## 2021-09-24 NOTE — Progress Notes (Signed)
Labor Progress Note Randa LAUREN MODISETTE is a 20 y.o. G2P1001 at [redacted]w[redacted]d presented for SOL  S: Resting, feeling comfortable with epidural. Not feeling pressure or urge to push.    O:  BP 118/73    Pulse 67    Temp 98.2 F (36.8 C) (Oral)    Resp 18    Ht 5\' 4"  (1.626 m)    Wt 72.6 kg    LMP 12/10/2020    SpO2 100%    BMI 27.46 kg/m  EFM: 115/mod variability/+accels,no decels  CVE: Dilation: 8 Effacement (%): 100 Station: -1 Presentation: Vertex Exam by:: [email or web address] RN  A&P: 20 y.o. G2P1001 [redacted]w[redacted]d here for SOL #Labor: Progressing well. Will plan for AROM when partner returns.  #Pain: Epidural #FWB: cat 1 #GBS negative  [redacted]w[redacted]d, MD PGY-2 9:00 AM

## 2021-09-24 NOTE — Anesthesia Procedure Notes (Signed)
Epidural Patient location during procedure: OB Start time: 09/24/2021 7:40 AM End time: 09/24/2021 7:50 AM  Staffing Anesthesiologist: Mellody Dance, MD Performed: anesthesiologist   Preanesthetic Checklist Completed: patient identified, IV checked, site marked, risks and benefits discussed, monitors and equipment checked, pre-op evaluation and timeout performed  Epidural Patient position: sitting Prep: DuraPrep Patient monitoring: heart rate, cardiac monitor, continuous pulse ox and blood pressure Approach: midline Location: L3-L4 Injection technique: LOR saline  Needle:  Needle type: Tuohy  Needle gauge: 17 G Needle length: 9 cm Needle insertion depth: 4 cm Catheter type: closed end flexible Catheter size: 20 Guage Catheter at skin depth: 9 cm Test dose: negative and Other  Assessment Events: blood not aspirated, injection not painful, no injection resistance and negative IV test  Additional Notes Informed consent obtained prior to proceeding including risk of failure, 1% risk of PDPH, risk of minor discomfort and bruising.  Discussed rare but serious complications including epidural abscess, permanent nerve injury, epidural hematoma.  Discussed alternatives to epidural analgesia and patient desires to proceed.  Timeout performed pre-procedure verifying patient name, procedure, and platelet count.  Patient tolerated procedure well.

## 2021-09-24 NOTE — Lactation Note (Signed)
This note was copied from a baby's chart. Lactation Consultation Note  Patient Name: Crystal Roach [id number] Date: 09/24/2021 Reason for consult: Initial assessment;Mother's request;Early term 37-38.6wks;Breastfeeding assistance;Other (Comment) (Anemia ( ferrous sulfate)) Age:20 hours Infant latched well with signs of milk transfer with audible swallows heard.   Plan 1. To feed based on cues 8-12x 24hr period offering breasts noting signs of milk transfer.  2. If unable to latch, Mom to offer colostrum via spoon 5-7 ml per feeding.  3. I and O sheet reviewed.  All questions answered at the end of the visit.   Maternal Data Has patient been taught Hand Expression?: Yes  Feeding Mother's Current Feeding Choice: Breast Milk  LATCH Score Latch: Repeated attempts needed to sustain latch, nipple held in mouth throughout feeding, stimulation needed to elicit sucking reflex.  Audible Swallowing: Spontaneous and intermittent  Type of Nipple: Everted at rest and after stimulation  Comfort (Breast/Nipple): Soft / non-tender  Hold (Positioning): Assistance needed to correctly position infant at breast and maintain latch.  LATCH Score: 8   Lactation Tools Discussed/Used    Interventions Interventions: Breast feeding basics reviewed;Assisted with latch;Skin to skin;Breast massage;Breast compression;Adjust position;Support pillows;Position options;Expressed milk;Education;LC Psychologist, educational;Visual merchandiser education  Discharge WIC Program: Yes  Consult Status Consult Status: Follow-up Date: 09/25/21 Follow-up type: In-patient    Crystal Dunavan  Roach 09/24/2021, 4:14 PM

## 2021-09-24 NOTE — MAU Note (Signed)
.  Crystal Roach is a 20 y.o. at [redacted]w[redacted]d here in MAU reporting: intense ctx that started at 2200 on 09/23/21. Pt was awakened at 0200 to ctx pain 8/10. Reports ctx every 3 minutes prior to arrival. Denies VB/LOF. Endorses +FM. Onset of complaint: 09/23/21 Pain score: 8/10 Vitals:   09/24/21 0451 09/24/21 0455  BP: 109/76 110/75  Pulse: 77 85  Resp: 16   Temp: 98.9 F (37.2 C)   SpO2:  97%     FHT:131 Lab orders placed from triage:

## 2021-09-25 ENCOUNTER — Encounter: Payer: Medicaid Other | Admitting: Obstetrics and Gynecology

## 2021-09-25 MED ORDER — IBUPROFEN 600 MG PO TABS: 600.0000 mg | ORAL_TABLET | Freq: Four times a day (QID) | ORAL | 0 refills | Status: AC

## 2021-09-25 MED ORDER — SLYND 4 MG PO TABS: 1.0000 | ORAL_TABLET | Freq: Every day | ORAL | 11 refills | Status: AC

## 2021-09-25 NOTE — Clinical Social Work Maternal (Addendum)
CLINICAL SOCIAL WORK MATERNAL/CHILD NOTE  Patient Details  Name: Crystal Roach MRN: [id number] Date of Birth: [birth year]  Date:  09/25/2021  Clinical Social Worker Initiating Note:  Kathrin Greathouse, [place] Date/Time: Initiated:  09/25/21/1000     Child's Name:  Crystal Roach   Biological Parents:  Mother, Father Crystal Roach 04-06-01, Consepcion Roach 09-14-1997)   Need for Interpreter:  None   Reason for Referral:  Late or No Prenatal Care     Address:  [address] [place] [address]    Phone number:  [phone] (home)     Additional phone number:   Household Members/Support Persons (HM/SP):   Household Member/Support Person 1, Household Member/Support Person 2, Household Member/Support Person 3   HM/SP Name Relationship DOB or Age  HM/SP -1 Shellby Schlink Mother 06-23-1977  HM/SP -2 Consepcion Roach Significant Other 09-14-1997  HM/SP -3 Crystal Roach Daughter 12-17-2019  HM/SP -4        HM/SP -5        HM/SP -6        HM/SP -7        HM/SP -8          Natural Supports (not living in the home):      Professional Supports:     Employment: Unemployed  FOB employed at [employer] of Work:     Education:  [place] arranged:    Museum/gallery curator Resources:  Medicaid   Other Resources:   (Maternal Grandmother receives [employer] for the household/ MOB to apply for [facility])   Cultural/Religious Considerations Which May Impact Care:    Strengths:  Ability to meet basic needs  , Home prepared for child  , Pediatrician chosen   Psychotropic Medications:         Pediatrician:    [place]  Pediatrician List:   Daryel Gerald and [place]   [place]      Pediatrician Fax Number:    Risk Factors/Current Problems:      Cognitive State:  Able to Concentrate  , Alert  , Insightful  , Linear Thinking     Mood/Affect:  Calm  , Happy     CSW  Assessment:  CSW received consult for Late [facility]. CSW met with MOB to offer support and complete assessment.    CSW met with MOB at bedside and introduced CSW role. CSW observed MOB eating breakfast and the infant asleep in the bassinet. MOB presented pleasant and receptive to [place] visit. MOB confirmed the demographic information on file is correct. MOB reported that she, FOB, her mom, and daughter (see chart above) live together. MOB identified her mom and FOB as supports. CSW inquired how MOB has felt since giving birth. MOB reported feeling good. CSW inquired about MOB PNC initiated at 29 weeks. MOB reported that she did not have reliable transportation to the appointments. CSW assessed MOB for current transportation barriers. MOB reported at this time she has reliable transportation to appointments. CSW educated MOB about Medicaid transportation and provided resources for follow up. CSW informed MOB about [place] transportation to appointments as well. MOB was very appreciative for the information provided. CSW informed MOB about the hospital drug screen policy and made MOB that CSW will follow the infants UDS/CDS and make a report to CPS, if warranted. MOB reported understanding and had questions. MOB  reported CPS history in 2021, for marijuana use and infant's positive drug screen. MOB reported the case was closed and reported no other CPS history. MOB denied any substance use this pregnancy.   MOB denied mental health history or PPD. CSW provided education regarding the baby blues period vs. perinatal mood disorders, discussed treatment and gave resources for mental health follow up if concerns arise.  CSW recommended MOB complete a self-evaluation during the postpartum time period using the New Mom Checklist from Postpartum Progress and encouraged MOB to contact a medical professional if symptoms are noted at any time. MOB reported she feels comfortable reaching out to her doctor if she has concerns. MOB denied  SI/HI and Domestic Violence.   CSW provided review of Sudden Infant Death Syndrome (SIDS) precautions. MOB reported she has essential items for the infant including a bassinet where the infant will sleep. MOB has chosen [facility] [employer] for infant's follow up care. CSW assessed MOB for additional need. MOB reported no further needs.   -CSW will follow the infant CDS/UDS and make a report, if warranted.   CSW identifies no further need for intervention and no barriers to discharge at this time.   CSW Plan/Description:  Sudden Infant Death Syndrome (SIDS) Education, CSW Will Continue to Monitor Umbilical Cord Tissue Drug Screen Results and Make Report if Warranted, Other Information/Referral to [place], Perinatal Mood and Anxiety Disorder (PMADs) Education, No Further Intervention Required/No Barriers to Discharge    Lia Hopping, LCSW 09/25/2021, 11:31 AM

## 2021-09-25 NOTE — Lactation Note (Signed)
This note was copied from a baby's chart. Lactation Consultation Note  Patient Name: Crystal Roach [id number] Date: 09/25/2021 Reason for consult: Follow-up assessment;Early term 37-38.6wks Age:20 hours  Infant was at breast when I entered room. Frequent swallows verified by cervical auscultation (sometimes with a 1:1 ratio). Mom was comfortable with latch.  Mom does not have a pump at home, so I provided a hand pump with a size 21 flange. I do not think Mom needs to pump at this time. Mom knows how to reach [place] for post-discharge questions & I also informed her about [name].   Note: With Mom's 1st baby, she was able to pump 5-6 oz from each breast.   Lurline Hare [facility] 09/25/2021, 2:35 PM

## 2021-09-25 NOTE — Anesthesia Postprocedure Evaluation (Signed)
Anesthesia Post Note  Patient: Crystal Roach  Procedure(s) Performed: AN AD HOC LABOR EPIDURAL     Patient location during evaluation: Mother Baby Anesthesia Type: Epidural Level of consciousness: awake and alert Pain management: pain level controlled Vital Signs Assessment: post-procedure vital signs reviewed and stable Respiratory status: spontaneous breathing, nonlabored ventilation and respiratory function stable Cardiovascular status: stable Postop Assessment: no headache, no backache, epidural receding, no apparent nausea or vomiting, patient able to bend at knees, adequate PO intake and able to ambulate Anesthetic complications: no   No notable events documented.  Last Vitals:  Vitals:   09/24/21 2312 09/25/21 0524  BP: 104/76 103/72  Pulse: 70 65  Resp: 18 18  Temp: 36.9 C 36.7 C  SpO2:      Last Pain:  Vitals:   09/25/21 0955  TempSrc:   PainSc: 0-No pain   Pain Goal:                   Laban Emperor

## 2021-10-07 ENCOUNTER — Telehealth (HOSPITAL_COMMUNITY): Payer: Self-pay | Admitting: *Deleted

## 2021-10-07 NOTE — Telephone Encounter (Signed)
Attempted Hospital Discharge Follow-Up Call.  No answer.  Unable to leave a message. 

## 2021-10-20 ENCOUNTER — Other Ambulatory Visit: Payer: Self-pay | Admitting: Advanced Practice Midwife

## 2021-10-22 ENCOUNTER — Ambulatory Visit: Payer: Self-pay | Admitting: Obstetrics and Gynecology

## 2022-03-07 ENCOUNTER — Other Ambulatory Visit: Payer: Self-pay

## 2022-03-07 ENCOUNTER — Encounter (HOSPITAL_COMMUNITY): Payer: Self-pay | Admitting: Emergency Medicine

## 2022-03-07 ENCOUNTER — Emergency Department (HOSPITAL_COMMUNITY)
Admission: EM | Admit: 2022-03-07 | Discharge: 2022-03-08 | Discharge status: Against Medical Advice | Payer: Medicaid Other | Attending: Emergency Medicine | Admitting: Emergency Medicine

## 2022-03-07 DIAGNOSIS — N644 Mastodynia: Secondary | ICD-10-CM | POA: Diagnosis present

## 2022-03-07 DIAGNOSIS — L299 Pruritus, unspecified: Secondary | ICD-10-CM | POA: Diagnosis not present

## 2022-03-07 DIAGNOSIS — Z5321 Procedure and treatment not carried out due to patient leaving prior to being seen by health care provider: Secondary | ICD-10-CM | POA: Diagnosis not present

## 2022-03-07 NOTE — ED Triage Notes (Signed)
Pt reported to ED with c/o pain and itching to right nipple. Pt states he has had a boil on nipple that burst while she was sleeping and that she is concerned because of appearance. Nipple appears red and swollen, no active bleeding at this time. Pt endorses giving birth in December and breastfeeding afterwards but denies current breastfeeding at this time. Does not have hx of mastitis.

## 2022-03-08 NOTE — ED Notes (Signed)
Patient states she is leaving d/t wait time 

## 2023-04-03 ENCOUNTER — Ambulatory Visit (HOSPITAL_COMMUNITY)
Admission: EM | Admit: 2023-04-03 | Discharge: 2023-04-03 | Disposition: A | Discharge status: Home / Self Care | Payer: Medicaid Other | Attending: Internal Medicine | Admitting: Internal Medicine

## 2023-04-03 ENCOUNTER — Encounter (HOSPITAL_COMMUNITY): Payer: Self-pay | Admitting: *Deleted

## 2023-04-03 ENCOUNTER — Other Ambulatory Visit: Payer: Self-pay

## 2023-04-03 DIAGNOSIS — N898 Other specified noninflammatory disorders of vagina: Secondary | ICD-10-CM | POA: Insufficient documentation

## 2023-04-03 NOTE — ED Triage Notes (Signed)
Pt reports for 4 days she has had a Vag discharge that is brown to white. Pt has a lot of itching in Vag area.

## 2023-04-03 NOTE — ED Provider Notes (Signed)
[facility]    CSN: [id number] Arrival date & time: 04/03/23  1407      History   Chief Complaint Chief Complaint  Patient presents with   Vaginal Discharge    HPI Crystal Roach is a 22 y.o. female presents to ED today with complaint of vaginal discharge, itching and odor.  She reports this started 4 days ago.  She reports the discharge is white/brown in color.  She is unable to describe the odor.  She denies pelvic pain, back pain, nausea, vomiting, fever or chills.  She denies urinary symptoms.  She is sexually active but not concerned about STDs.  She reports her LMP was 2 weeks ago.  She has not tried anything OTC for this.  HPI  Past Medical History:  Diagnosis Date   Anemia    Chlamydia    Indication for care or intervention in labor or delivery 12/16/2019   Infection    UTI   Medical history non-contributory     Patient Active Problem List   Diagnosis Date Noted   Normal labor and delivery 09/24/2021   Supervision of other normal pregnancy, antepartum 02/24/2021   Candida vaginitis 11/23/2019   Gastroesophageal reflux disease without esophagitis 08/01/2019   Tail bone pain 08/01/2019    Past Surgical History:  Procedure Laterality Date   NO PAST SURGERIES      OB History     Gravida  2   Para  2   Term  2   Preterm      AB      Living  2      SAB      IAB      Ectopic      Multiple  0   Live Births  2            Home Medications    Prior to Admission medications   Medication Sig Start Date End Date Taking? Authorizing Provider  Drospirenone (SLYND) 4 MG TABS Take 1 tablet by mouth daily. 09/25/21   Aviva Signs, CNM  ferrous sulfate 325 (65 FE) MG tablet Take 325 mg by mouth daily with breakfast.    [provider]  ibuprofen (ADVIL) 600 MG tablet Take 1 tablet (600 mg total) by mouth every 6 (six) hours. 09/25/21   Aviva Signs, CNM  SPRINTEC 28 0.25-35 MG-MCG tablet Take 1 tablet by mouth  daily. Not taking in one month 05/15/18 04/12/19  [provider]    Family History Family History  Problem Relation Age of Onset   Seizures Father     Social History Social History   Tobacco Use   Smoking status: Never   Smokeless tobacco: Never  Vaping Use   Vaping Use: Never used  Substance Use Topics   Alcohol use: Never   Drug use: Not Currently    Types: Marijuana    Comment: last was 3wk ago when found preg     Allergies   Patient has no known allergies.   Review of Systems Review of Systems  Constitutional:  Negative for chills and fever.  Respiratory:  Negative for cough, chest tightness and shortness of breath.   Cardiovascular:  Negative for chest pain.  Gastrointestinal:  Negative for abdominal distention, abdominal pain, nausea and vomiting.  Genitourinary:  Positive for dyspareunia, vaginal discharge and vaginal pain. Negative for dysuria, frequency, menstrual problem, pelvic pain, urgency and vaginal bleeding.  Musculoskeletal:  Negative for arthralgias and myalgias.  Skin:  Negative for rash.  Neurological:  Negative for dizziness and headaches.     Physical Exam Triage Vital Signs ED Triage Vitals  Enc Vitals Group     BP 04/03/23 1451 (!) 99/58     Pulse Rate 04/03/23 1451 (!) 55     Resp 04/03/23 1451 16     Temp 04/03/23 1451 98 F (36.7 C)     Temp src --      SpO2 04/03/23 1451 97 %     Weight --      Height --      Head Circumference --      Peak Flow --      Pain Score 04/03/23 1453 9     Pain Loc --      Pain Edu? --      Excl. in GC? --    No data found.  Updated Vital Signs BP (!) 99/58   Pulse (!) 55   Temp 98 F (36.7 C)   Resp 16   LMP 03/20/2023 (Approximate)   SpO2 97%     Physical Exam Constitutional:      Appearance: Normal appearance.  HENT:     Head: Normocephalic.  Cardiovascular:     Rate and Rhythm: Regular rhythm. Bradycardia present.     Heart sounds: Normal heart sounds.  Pulmonary:      Effort: Pulmonary effort is normal.     Breath sounds: Normal breath sounds.  Abdominal:     General: Abdomen is flat. There is no distension.     Palpations: Abdomen is soft. There is no mass.     Tenderness: There is no abdominal tenderness.  Skin:    General: Skin is warm and dry.     Findings: No rash.  Neurological:     Mental Status: She is alert and oriented to person, place, and time.      UC Treatments / Results  Labs Labs Reviewed  CERVICOVAGINAL ANCILLARY ONLY     Initial Impression / Assessment and Plan / UC Course  I have reviewed the triage vital signs and the nursing notes.  Pertinent labs & imaging results that were available during my care of the patient were reviewed by me and considered in my medical decision making (see chart for details).     22 year old female with 4-day history of vaginal discharge, itching and odor.  DDx include STD, BV, yeast.  She does not want STD screening.  Wet prep obtained for BV and yeast.  Advised her if she was positive for BV we will treat with metronidazole 500 mg twice daily x 7 days or if she was positive for yeast we will treat with Diflucan 150 mg p.o. x 1.  Encouraged her to avoid douching, bubble baths or scented feminine products.  She will follow-up if symptoms persist or worsen.  Final Clinical Impressions(s) / UC Diagnoses   Final diagnoses:  Vaginal discharge  Vaginal odor  Vaginal itching     Discharge Instructions      You were seen today for vaginal discharge, itching and odor.  We have sent off a test that checks for BV and yeast.  We will call you with any abnormal results and treat accordingly.  Please follow-up if your symptoms persist or worsen.     ED Prescriptions   None    PDMP not reviewed this encounter. Nicki Reaper, NP    [place], [place] 04/03/23 [phone]

## 2023-04-03 NOTE — Discharge Instructions (Signed)
You were seen today for vaginal discharge, itching and odor.  We have sent off a test that checks for BV and yeast.  We will call you with any abnormal results and treat accordingly.  Please follow-up if your symptoms persist or worsen.

## 2023-04-04 ENCOUNTER — Telehealth (HOSPITAL_COMMUNITY): Payer: Self-pay | Admitting: Emergency Medicine

## 2023-04-04 LAB — CERVICOVAGINAL ANCILLARY ONLY
Bacterial Vaginitis (gardnerella): POSITIVE — AB
Candida Glabrata: NEGATIVE
Candida Vaginitis: NEGATIVE
Comment: NEGATIVE
Comment: NEGATIVE
Comment: NEGATIVE

## 2023-04-04 MED ORDER — METRONIDAZOLE 500 MG PO TABS: 500.0000 mg | ORAL_TABLET | Freq: Two times a day (BID) | ORAL | 0 refills | Status: AC
